# Patient Record
Sex: Male | Born: 1965 | Race: Black or African American | Hispanic: No | Marital: Single
Health system: Midwestern US, Community
[De-identification: ages and names within clinical notes are randomized; demographics above are authoritative.]

## PROBLEM LIST (undated history)

## (undated) DIAGNOSIS — H409 Unspecified glaucoma: Secondary | ICD-10-CM

## (undated) DIAGNOSIS — I10 Essential (primary) hypertension: Secondary | ICD-10-CM

## (undated) HISTORY — PX: OTHER SURGICAL HISTORY: SHX169

---

## 2007-09-30 ENCOUNTER — Emergency Department (HOSPITAL_COMMUNITY): Admission: EM | Admit: 2007-09-30 | Discharge: 2007-09-30 | Payer: Self-pay | Admitting: Emergency Medicine

## 2007-12-30 ENCOUNTER — Emergency Department (HOSPITAL_COMMUNITY): Admission: EM | Admit: 2007-12-30 | Discharge: 2007-12-30 | Payer: Self-pay | Admitting: Emergency Medicine

## 2012-03-30 ENCOUNTER — Emergency Department (HOSPITAL_COMMUNITY): Payer: Self-pay

## 2012-03-30 ENCOUNTER — Encounter (HOSPITAL_COMMUNITY): Payer: Self-pay | Admitting: Emergency Medicine

## 2012-03-30 ENCOUNTER — Emergency Department (HOSPITAL_COMMUNITY)
Admission: EM | Admit: 2012-03-30 | Discharge: 2012-03-30 | Disposition: A | Discharge status: Home / Self Care | Payer: Self-pay | Attending: Emergency Medicine | Admitting: Emergency Medicine

## 2012-03-30 DIAGNOSIS — R5383 Other fatigue: Secondary | ICD-10-CM | POA: Insufficient documentation

## 2012-03-30 DIAGNOSIS — R5381 Other malaise: Secondary | ICD-10-CM | POA: Insufficient documentation

## 2012-03-30 DIAGNOSIS — R112 Nausea with vomiting, unspecified: Secondary | ICD-10-CM | POA: Insufficient documentation

## 2012-03-30 DIAGNOSIS — I10 Essential (primary) hypertension: Secondary | ICD-10-CM | POA: Insufficient documentation

## 2012-03-30 DIAGNOSIS — Z7982 Long term (current) use of aspirin: Secondary | ICD-10-CM | POA: Insufficient documentation

## 2012-03-30 DIAGNOSIS — Z79899 Other long term (current) drug therapy: Secondary | ICD-10-CM | POA: Insufficient documentation

## 2012-03-30 DIAGNOSIS — R42 Dizziness and giddiness: Secondary | ICD-10-CM | POA: Insufficient documentation

## 2012-03-30 HISTORY — DX: Essential (primary) hypertension: I10

## 2012-03-30 LAB — CBC WITH DIFFERENTIAL/PLATELET
Basophils Absolute: 0 K/uL (ref 0.0–0.1)
Basophils Relative: 0 % (ref 0–1)
Eosinophils Absolute: 0.1 K/uL (ref 0.0–0.7)
Eosinophils Relative: 1 % (ref 0–5)
HCT: 44.5 % (ref 39.0–52.0)
Hemoglobin: 15.4 g/dL (ref 13.0–17.0)
Lymphocytes Relative: 20 % (ref 12–46)
Lymphs Abs: 1.1 K/uL (ref 0.7–4.0)
MCH: 28.2 pg (ref 26.0–34.0)
MCHC: 34.6 g/dL (ref 30.0–36.0)
MCV: 81.4 fL (ref 78.0–100.0)
Monocytes Absolute: 0.7 K/uL (ref 0.1–1.0)
Monocytes Relative: 11 % (ref 3–12)
Neutro Abs: 3.9 K/uL (ref 1.7–7.7)
Neutrophils Relative %: 68 % (ref 43–77)
Platelets: 157 K/uL (ref 150–400)
RBC: 5.47 MIL/uL (ref 4.22–5.81)
RDW: 13.8 % (ref 11.5–15.5)
WBC: 5.8 K/uL (ref 4.0–10.5)

## 2012-03-30 LAB — COMPREHENSIVE METABOLIC PANEL
ALT: 37 U/L (ref 0–53)
AST: 31 U/L (ref 0–37)
Albumin: 4.3 g/dL (ref 3.5–5.2)
Alkaline Phosphatase: 47 U/L (ref 39–117)
Chloride: 104 mEq/L (ref 96–112)
GFR calc Af Amer: >90 mL/min (ref >90)
Glucose, Bld: 95 mg/dL (ref 70–99)
Total Protein: 7.5 g/dL (ref 6.0–8.3)

## 2012-03-30 LAB — URINALYSIS, MICROSCOPIC ONLY
Bilirubin Urine: NEGATIVE
Glucose, UA: NEGATIVE mg/dL
Hgb urine dipstick: NEGATIVE
Ketones, ur: 40 mg/dL — AB
Leukocytes, UA: NEGATIVE
Nitrite: NEGATIVE
Protein, ur: NEGATIVE mg/dL
Specific Gravity, Urine: 1.031 — ABNORMAL HIGH (ref 1.005–1.030)
Urobilinogen, UA: 2 mg/dL — ABNORMAL HIGH (ref 0.0–1.0)
pH: 6 (ref 5.0–8.0)

## 2012-03-30 LAB — LACTIC ACID, PLASMA: Lactic Acid, Venous: 0.9 mmol/L (ref 0.5–2.2)

## 2012-03-30 MED ORDER — MORPHINE SULFATE 4 MG/ML IJ SOLN
4.0000 mg | Freq: Once | INTRAMUSCULAR | Status: AC
Start: 2012-03-30 — End: 2012-03-30
  Administered 2012-03-30: 4 mg via INTRAVENOUS
  Filled 2012-03-30: qty 1

## 2012-03-30 MED ORDER — IOHEXOL 300 MG/ML  SOLN
100.0000 mL | Freq: Once | INTRAMUSCULAR | Status: AC | PRN
  Administered 2012-03-30: 100 mL via INTRAVENOUS

## 2012-03-30 MED ORDER — ONDANSETRON HCL 4 MG/2ML IJ SOLN
4.0000 mg | Freq: Once | INTRAMUSCULAR | Status: AC
  Administered 2012-03-30: 4 mg via INTRAVENOUS
  Filled 2012-03-30: qty 2

## 2012-03-30 MED ORDER — SODIUM CHLORIDE 0.9 % IV SOLN
1000.0000 mL | Freq: Once | INTRAVENOUS | Status: AC
  Administered 2012-03-30: 1000 mL via INTRAVENOUS

## 2012-03-30 MED ORDER — IOHEXOL 300 MG/ML  SOLN
50.0000 mL | Freq: Once | INTRAMUSCULAR | Status: AC | PRN
  Administered 2012-03-30: 50 mL via ORAL

## 2012-03-30 MED ORDER — ONDANSETRON HCL 4 MG PO TABS: 4.0000 mg | ORAL_TABLET | Freq: Four times a day (QID) | ORAL | Status: DC

## 2012-03-30 NOTE — ED Provider Notes (Signed)
History     CSN: [id number]  Arrival date & time 03/30/12  1002   First MD Initiated Contact with Patient 03/30/12 1013      Chief Complaint  Patient presents with  . Abdominal Pain    (Consider location/radiation/quality/duration/timing/severity/associated sxs/prior treatment) HPI Comments: Patient claims of abdominal pain with diarrhea and nausea and vomiting over the past 5 days. He states it started 5 days ago after he ate a chicken salad. The next day he had diarrhea with lower abdominal pain. This improved over the next day but then he develops nausea and vomiting with continued lower abdominal pain. He's had no diarrhea for the past 3 days. This morning he began throwing up again and unable to keep anything down. Denies sick contacts at home. Denies any antibiotics recently. Denies any testicular pain, dysuria or hematuria. Has not checked his temperature.  The history is provided by the patient and the spouse.    Past Medical History  Diagnosis Date  . Hypertension     History reviewed. No pertinent past surgical history.  No family history on file.  History  Substance Use Topics  . Smoking status: Never Smoker   . Smokeless tobacco: Not on file  . Alcohol Use: No      Review of Systems  Constitutional: Positive for activity change, appetite change and fatigue. Negative for fever.  HENT: Negative for congestion and rhinorrhea.   Eyes: Negative for visual disturbance.  Respiratory: Negative for cough, chest tightness and shortness of breath.   Cardiovascular: Negative for chest pain.  Gastrointestinal: Positive for nausea, vomiting, abdominal pain and diarrhea.  Genitourinary: Negative for dysuria and hematuria.  Musculoskeletal: Negative for back pain.  Skin: Negative for rash.  Neurological: Positive for dizziness and weakness. Negative for headaches.  A complete 10 system review of systems was obtained and all systems are negative except as noted in the HPI  and PMH.    Allergies  Review of patient's allergies indicates no known allergies.  Home Medications   Current Outpatient Rx  Name  Route  Sig  Dispense  Refill  . aspirin 81 MG tablet   Oral   Take 81 mg by mouth daily.         Marland Kitchen lisinopril (PRINIVIL,ZESTRIL) 20 MG tablet   Oral   Take 20 mg by mouth daily.         . ondansetron (ZOFRAN) 4 MG tablet   Oral   Take 1 tablet (4 mg total) by mouth every 6 (six) hours.   12 tablet   0     BP 136/91  Pulse 58  Temp(Src) 97.5 F (36.4 C) (Oral)  Resp 16  SpO2 97%  Physical Exam  Constitutional: He is oriented to person, place, and time. He appears well-developed and well-nourished. No distress.  HENT:  Head: Normocephalic and atraumatic.  Mouth/Throat: No oropharyngeal exudate.  Mildly dry mucous membranes  Eyes: Conjunctivae and EOM are normal. Pupils are equal, round, and reactive to light.  Cardiovascular: Normal rate, regular rhythm and normal heart sounds.   No murmur heard. Pulmonary/Chest: Effort normal and breath sounds normal. No respiratory distress.  Abdominal: Soft. There is tenderness. There is no rebound and no guarding.  Generalized diffuse abdominal tenderness  Musculoskeletal: Normal range of motion. He exhibits no edema and no tenderness.  Neurological: He is alert and oriented to person, place, and time. No cranial nerve deficit. He exhibits normal muscle tone. Coordination normal.  Skin: Skin is warm.  ED Course  Procedures (including critical care time)  Labs Reviewed  COMPREHENSIVE METABOLIC PANEL - Abnormal; Notable for the following:    Potassium 3.3 (*)    Total Bilirubin 1.4 (*)    GFR calc non Af Amer 85 (*)    All other components within normal limits  URINALYSIS, MICROSCOPIC ONLY - Abnormal; Notable for the following:    APPearance CLOUDY (*)    Specific Gravity, Urine 1.031 (*)    Ketones, ur 40 (*)    Urobilinogen, UA 2.0 (*)    All other components within normal limits   CBC WITH DIFFERENTIAL  LIPASE, BLOOD  LACTIC ACID, PLASMA   Ct Abdomen Pelvis W Contrast  03/30/2012  *RADIOLOGY REPORT*  Clinical Data: Abdominal pain with nausea and vomiting for 4 days.  CT ABDOMEN AND PELVIS WITH CONTRAST  Technique:  Multidetector CT imaging of the abdomen and pelvis was performed following the standard protocol during bolus administration of intravenous contrast.  Contrast: OMNIPAQUE IOHEXOL 300 MG/ML  SOLN  Comparison: None.  Findings: Lung bases are clear.  No pleural or pericardial fluid.  The liver has a normal appearance except for a 3 mm cyst at the dome.  No calcified gallstones.  The spleen is normal.  The pancreas is normal.  The adrenal glands are normal.  The kidneys are normal except for a few punctate calculi.  No cyst, mass, or hydronephrosis.  The aorta shows minimal atherosclerosis.  The IVC is normal.  No retroperitoneal mass or adenopathy.  There is been previous appendectomy.  There is mild diverticulosis of the colon but no sign of diverticulitis.  No small bowel abnormality is seen. Bladder, prostate gland and seminal vesicles are unremarkable.  No significant bony finding.  IMPRESSION: No acute or significant finding.  No cause of the patient's described symptoms is identified.   Original Report Authenticated By: Paulina Fusi, M.D.      1. Nausea and vomiting       MDM  Four day history of abdominal pain, intermittent diarrhea which is now improved associated with nausea and vomiting it recurred again this morning. Vital stable, no distress, diffuse abdominal tenderness without peritoneal signs. Patient was worried that started after eating a salad but that was 5 days ago.  Ketones in urine.  Electrolytes unremarkable.    Given patient's duration of symptoms and his abdominal pain, we'll obtain CT imaging to rule out appendicitis.  CT negative. Patient has tolerated by mouth in ED and had no more vomiting or diarrhea. Will treat supportively  with antiemetics. Return precautions discussed.      Glynn Octave, MD 03/30/12 1525

## 2012-03-30 NOTE — ED Notes (Signed)
Pt presenting to ed with c/o abdominal pain with positive nausea, vomiting and diarrhea. Pt states he ate a salad on Wednesday and has been sick since then.

## 2012-05-03 ENCOUNTER — Encounter (HOSPITAL_COMMUNITY): Payer: Self-pay | Admitting: *Deleted

## 2012-05-03 ENCOUNTER — Emergency Department (HOSPITAL_COMMUNITY)
Admission: EM | Admit: 2012-05-03 | Discharge: 2012-05-03 | Disposition: A | Discharge status: Home / Self Care | Payer: Self-pay | Attending: Emergency Medicine | Admitting: Emergency Medicine

## 2012-05-03 DIAGNOSIS — Z7982 Long term (current) use of aspirin: Secondary | ICD-10-CM | POA: Insufficient documentation

## 2012-05-03 DIAGNOSIS — H579 Unspecified disorder of eye and adnexa: Secondary | ICD-10-CM | POA: Insufficient documentation

## 2012-05-03 DIAGNOSIS — H109 Unspecified conjunctivitis: Secondary | ICD-10-CM | POA: Insufficient documentation

## 2012-05-03 DIAGNOSIS — Z79899 Other long term (current) drug therapy: Secondary | ICD-10-CM | POA: Insufficient documentation

## 2012-05-03 DIAGNOSIS — H5789 Other specified disorders of eye and adnexa: Secondary | ICD-10-CM | POA: Insufficient documentation

## 2012-05-03 DIAGNOSIS — J3489 Other specified disorders of nose and nasal sinuses: Secondary | ICD-10-CM | POA: Insufficient documentation

## 2012-05-03 DIAGNOSIS — I10 Essential (primary) hypertension: Secondary | ICD-10-CM | POA: Insufficient documentation

## 2012-05-03 MED ORDER — TOBRAMYCIN 0.3 % OP SOLN
2.0000 [drp] | OPHTHALMIC | Status: DC
  Administered 2012-05-03: 2 [drp] via OPHTHALMIC
  Filled 2012-05-03: qty 5

## 2012-05-03 NOTE — ED Provider Notes (Signed)
History     CSN: [id number]  Arrival date & time 05/03/12  0133   First MD Initiated Contact with Patient 05/03/12 0254      Chief Complaint  Patient presents with  . Eye Problem   HPI  History provided by the patient. Patient is a 47 year old male with history of hypertension who presents with complaints of left eye redness, swelling drainage. Symptoms first began Thursday with some bilateral eye itching and redness. Patient also had some rhinorrhea and congestion symptoms. After awaking Friday morning he had significant drainage and crusting from the left eye. He denies having any significant pain to the eye. Symptoms have been persistent through the day with continued watering and drainage from the eye. Patient is not use any treatment symptoms. He has no significant history of eye problems in the past. Does not use corrective lenses or contacts. Denies any known sick contacts. No recent travel. No other aggravating or alleviating factors. No other associated symptoms. No fever, chills or sweats.     Past Medical History  Diagnosis Date  . Hypertension     History reviewed. No pertinent past surgical history.  No family history on file.  History  Substance Use Topics  . Smoking status: Never Smoker   . Smokeless tobacco: Not on file  . Alcohol Use: Yes      Review of Systems  Constitutional: Negative for fever and chills.  HENT: Positive for congestion and rhinorrhea.   Eyes: Positive for discharge, redness and itching. Negative for photophobia and pain.  Respiratory: Negative for cough.   All other systems reviewed and are negative.    Allergies  Review of patient's allergies indicates no known allergies.  Home Medications   Current Outpatient Rx  Name  Route  Sig  Dispense  Refill  . aspirin 81 MG tablet   Oral   Take 81 mg by mouth daily.         . hydrochlorothiazide (HYDRODIURIL) 25 MG tablet   Oral   Take 25 mg by mouth daily.         Marland Kitchen  losartan (COZAAR) 50 MG tablet   Oral   Take 50 mg by mouth daily.           BP 107/78  Pulse 83  Temp(Src) 97.9 F (36.6 C) (Oral)  Resp 16  SpO2 95%  Physical Exam  Nursing note and vitals reviewed. Constitutional: He is oriented to person, place, and time. He appears well-developed and well-nourished. No distress.  HENT:  Head: Normocephalic.  Eyes: EOM are normal. Pupils are equal, round, and reactive to light. Left eye exhibits discharge. Left eye exhibits no chemosis. Right conjunctiva is not injected. Left conjunctiva is injected.  Cardiovascular: Normal rate and regular rhythm.   Pulmonary/Chest: Effort normal and breath sounds normal.  Abdominal: Soft.  Musculoskeletal: Normal range of motion.  Neurological: He is alert and oriented to person, place, and time.  Skin: Skin is warm and dry.  Psychiatric: He has a normal mood and affect. His behavior is normal.    ED Course  Procedures     1. Conjunctivitis       MDM  3:10AM patient seen and evaluated. Patient appears well in no acute distress.  Patient with erythema and discharge of the left eye conjunctiva. He denies any significant pains. Symptoms consistent with conjunctivitis. Tobramycin drops provided with ophthalmology referral.        Angus Seller, PA-C 05/03/12 0554    Medical screening  examination/treatment/procedure(s) were performed by non-physician practitioner and as supervising physician I was immediately available for consultation/collaboration.   Derwood Kaplan, MD 05/04/12 [phone]

## 2012-05-03 NOTE — ED Notes (Addendum)
C/o bilateral eye redness, onset Thursday, drainage onset Friday, describes crusty exudate, L>R, "thinks he rubbed them while he was sleeping". Denies fever. Eyes red and irritated. Denies pain, "just irritation".

## 2013-10-09 ENCOUNTER — Emergency Department (HOSPITAL_COMMUNITY)
Admission: EM | Admit: 2013-10-09 | Discharge: 2013-10-09 | Disposition: A | Discharge status: Home / Self Care | Payer: Self-pay | Attending: Emergency Medicine | Admitting: Emergency Medicine

## 2013-10-09 ENCOUNTER — Encounter (HOSPITAL_COMMUNITY): Payer: Self-pay | Admitting: Emergency Medicine

## 2013-10-09 DIAGNOSIS — Z8669 Personal history of other diseases of the nervous system and sense organs: Secondary | ICD-10-CM | POA: Insufficient documentation

## 2013-10-09 DIAGNOSIS — S0990XA Unspecified injury of head, initial encounter: Secondary | ICD-10-CM | POA: Insufficient documentation

## 2013-10-09 DIAGNOSIS — IMO0002 Reserved for concepts with insufficient information to code with codable children: Secondary | ICD-10-CM | POA: Insufficient documentation

## 2013-10-09 DIAGNOSIS — Z79899 Other long term (current) drug therapy: Secondary | ICD-10-CM | POA: Insufficient documentation

## 2013-10-09 DIAGNOSIS — Y9289 Other specified places as the place of occurrence of the external cause: Secondary | ICD-10-CM | POA: Insufficient documentation

## 2013-10-09 DIAGNOSIS — Y9389 Activity, other specified: Secondary | ICD-10-CM | POA: Insufficient documentation

## 2013-10-09 DIAGNOSIS — W208XXA Other cause of strike by thrown, projected or falling object, initial encounter: Secondary | ICD-10-CM | POA: Insufficient documentation

## 2013-10-09 DIAGNOSIS — S0001XA Abrasion of scalp, initial encounter: Secondary | ICD-10-CM

## 2013-10-09 DIAGNOSIS — Z7982 Long term (current) use of aspirin: Secondary | ICD-10-CM | POA: Insufficient documentation

## 2013-10-09 DIAGNOSIS — I10 Essential (primary) hypertension: Secondary | ICD-10-CM | POA: Insufficient documentation

## 2013-10-09 HISTORY — DX: Unspecified glaucoma: H40.9

## 2013-10-09 MED ORDER — ACETAMINOPHEN ER 650 MG PO TBCR: 650.0000 mg | EXTENDED_RELEASE_TABLET | Freq: Three times a day (TID) | ORAL | Status: DC | PRN

## 2013-10-09 MED ORDER — ONDANSETRON 4 MG PO TBDP
4.0000 mg | ORAL_TABLET | Freq: Once | ORAL | Status: AC
  Administered 2013-10-09: 4 mg via ORAL
  Filled 2013-10-09: qty 1

## 2013-10-09 MED ORDER — PROMETHAZINE HCL 25 MG PO TABS: 25.0000 mg | ORAL_TABLET | Freq: Four times a day (QID) | ORAL | Status: DC | PRN

## 2013-10-09 MED ORDER — ACETAMINOPHEN 500 MG PO TABS
1000.0000 mg | ORAL_TABLET | Freq: Once | ORAL | Status: AC
  Administered 2013-10-09: 1000 mg via ORAL
  Filled 2013-10-09: qty 2

## 2013-10-09 NOTE — ED Provider Notes (Signed)
CSN: [id number]     Arrival date & time 10/09/13  4098 History  This chart was scribed for non-physician practitioner, Emilia Beck, PA-C, working with Doug Sou, MD, by Modena Jansky, ED Scribe. This patient was seen in room WTR6/WTR6 and the patient's care was started at 6:54 PM.  Chief Complaint  Patient presents with  . Head Injury  . Headache   The history is provided by the patient. No language interpreter was used.   HPI Comments: Jon Green is a 48 y.o. male who presents to the Emergency Department complaining of a headache that started about 2 hours ago. He states that a ceiling tile fell on his head while [place] [employer]. He denies any LOC. He currently rates the severity of the pain as a 8/10. He describes the pain as a throbbing sensation. He reports associated dizziness onset later on. He denies any blurry vision or trouble ambulating.   Past Medical History  Diagnosis Date  . Hypertension   . Glaucoma    History reviewed. No pertinent past surgical history. History reviewed. No pertinent family history. History  Substance Use Topics  . Smoking status: Never Smoker   . Smokeless tobacco: Not on file  . Alcohol Use: Yes    Review of Systems  Neurological: Positive for dizziness and headaches.  All other systems reviewed and are negative.   Allergies  Review of patient's allergies indicates no known allergies.  Home Medications   Prior to Admission medications   Medication Sig Start Date End Date Taking? Authorizing Provider  aspirin 81 MG tablet Take 81 mg by mouth daily.    Historical Provider, MD  hydrochlorothiazide (HYDRODIURIL) 25 MG tablet Take 25 mg by mouth daily.    Historical Provider, MD  losartan (COZAAR) 50 MG tablet Take 50 mg by mouth daily.    Historical Provider, MD   BP 122/63  Pulse 63  Temp(Src) 98.5 F (36.9 C) (Oral)  Resp 16  SpO2 99% Physical Exam  Nursing note and vitals reviewed. Constitutional:  He is oriented to person, place, and time. He appears well-developed and well-nourished.  HENT:  Mild abrasion to left temporal parietal scalp. Mild TTP. No open wounds or hematoma noted.   Eyes: Pupils are equal, round, and reactive to light.  Neck: Neck supple. No tracheal deviation present.  Cardiovascular: Normal rate.   Pulmonary/Chest: Effort normal. No respiratory distress.  Musculoskeletal: Normal range of motion.  Neurological: He is alert and oriented to person, place, and time. Coordination normal.  Extremity strength and sensation intact bilaterally.   Skin: Skin is warm and dry.  Psychiatric: He has a normal mood and affect. His behavior is normal.    ED Course  Procedures (including critical care time) DIAGNOSTIC STUDIES: Oxygen Saturation is 99% on RA, normal by my interpretation.    COORDINATION OF CARE: 6:59 PM- Pt advised of plan for treatment which includes medication and pt agrees.  Labs Review Labs Reviewed - No data to display  Imaging Review No results found.   EKG Interpretation None      MDM   Final diagnoses:  Head injury, initial encounter  Scalp abrasion, initial encounter    7:56 PM Patient given tylenol and zofran for symptoms. Patient will not have a head CT based on [place] CT criteria and absence of symptoms. Patient denies LOC. Vitals stable and patient afebrile. No hematoma or depressed skull fracture noted on clinical exam. Patient will be discharged with tylenol  and phenergan for symptoms. No neuro deficits.   I personally performed the services described in this documentation, which was scribed in my presence. The recorded information has been reviewed and is accurate.     Emilia Beck, [place] 10/09/13 [phone]

## 2013-10-09 NOTE — ED Notes (Signed)
Patient with no complaints at this time. Respirations even and unlabored. Skin warm/dry. Discharge instructions reviewed with patient at this time. Patient given opportunity to voice concerns/ask questions. Patient discharged at this time and left Emergency Department with steady gait.   

## 2013-10-09 NOTE — Discharge Instructions (Signed)
Take tylenol as needed for pain. Take phenergan as needed for nausea. Refer to attached documents for more information.

## 2013-10-09 NOTE — ED Notes (Addendum)
Pt c/o headache and dizziness after a drop ceiling tile fell on his head, at [employer].  Pain score 8/10.  Denies blurred vision.  No hematoma noted.

## 2013-10-10 NOTE — ED Provider Notes (Signed)
Medical screening examination/treatment/procedure(s) were performed by non-physician practitioner and as supervising physician I was immediately available for consultation/collaboration.   EKG Interpretation None       Doug Sou, MD 10/10/13 2130

## 2014-02-18 ENCOUNTER — Encounter (HOSPITAL_COMMUNITY): Payer: Self-pay | Admitting: Emergency Medicine

## 2014-02-18 ENCOUNTER — Emergency Department (HOSPITAL_COMMUNITY)
Admission: EM | Admit: 2014-02-18 | Discharge: 2014-02-18 | Disposition: A | Discharge status: Home / Self Care | Payer: Self-pay | Attending: Emergency Medicine | Admitting: Emergency Medicine

## 2014-02-18 DIAGNOSIS — Z711 Person with feared health complaint in whom no diagnosis is made: Secondary | ICD-10-CM

## 2014-02-18 DIAGNOSIS — H409 Unspecified glaucoma: Secondary | ICD-10-CM | POA: Insufficient documentation

## 2014-02-18 DIAGNOSIS — R300 Dysuria: Secondary | ICD-10-CM | POA: Insufficient documentation

## 2014-02-18 DIAGNOSIS — I10 Essential (primary) hypertension: Secondary | ICD-10-CM | POA: Insufficient documentation

## 2014-02-18 DIAGNOSIS — Z202 Contact with and (suspected) exposure to infections with a predominantly sexual mode of transmission: Secondary | ICD-10-CM | POA: Insufficient documentation

## 2014-02-18 DIAGNOSIS — R369 Urethral discharge, unspecified: Secondary | ICD-10-CM | POA: Insufficient documentation

## 2014-02-18 LAB — URINALYSIS, ROUTINE W REFLEX MICROSCOPIC
GLUCOSE, UA: NEGATIVE mg/dL
KETONES UR: NEGATIVE mg/dL
NITRITE: NEGATIVE
Protein, ur: 30 mg/dL — AB
Specific Gravity, Urine: 1.036 — ABNORMAL HIGH (ref 1.005–1.030)
UROBILINOGEN UA: 1 mg/dL (ref 0.0–1.0)
pH: 5 (ref 5.0–8.0)

## 2014-02-18 LAB — GC/CHLAMYDIA PROBE AMP (~~LOC~~) NOT AT ARMC
CHLAMYDIA, DNA PROBE: NEGATIVE
NEISSERIA GONORRHEA: POSITIVE — AB

## 2014-02-18 LAB — URINE MICROSCOPIC-ADD ON

## 2014-02-18 MED ORDER — AZITHROMYCIN 250 MG PO TABS
1000.0000 mg | ORAL_TABLET | Freq: Once | ORAL | Status: AC
  Administered 2014-02-18: 1000 mg via ORAL
  Filled 2014-02-18: qty 4

## 2014-02-18 MED ORDER — STERILE WATER FOR INJECTION IJ SOLN
INTRAMUSCULAR | Status: AC
  Administered 2014-02-18: 2 mL
  Filled 2014-02-18: qty 10

## 2014-02-18 MED ORDER — CEFTRIAXONE SODIUM 250 MG IJ SOLR
250.0000 mg | Freq: Once | INTRAMUSCULAR | Status: AC
  Administered 2014-02-18: 250 mg via INTRAMUSCULAR
  Filled 2014-02-18: qty 250

## 2014-02-18 NOTE — ED Provider Notes (Signed)
CSN: [id number]     Arrival date & time 02/18/14  0023 History   First MD Initiated Contact with Patient 02/18/14 0037     Chief Complaint  Patient presents with  . Exposure to STD    (Consider location/radiation/quality/duration/timing/severity/associated sxs/prior Treatment) HPI Comments: 49 year old male sent to the emergency department for further evaluation of penile discharge and dysuria 2 days. Symptoms are gradually worsening. He states that he was told by his girlfriend that she was sexually active with another partner while he was out of town over the past 5 months. Patient is concerned that his symptoms are secondary to an STD. He states that his penile discharge has been whitish yellow in color. He denies any associated fever, abdominal pain, vomiting, hematuria, genital lesions or sores, testicular tenderness, or scrotal swelling.  Patient is a 49 y.o. male presenting with STD exposure. The history is provided by the patient. No language interpreter was used.  Exposure to STD This is a new problem. Episode onset: penile discharge began yesterday. The problem occurs constantly. The problem has been gradually worsening. Pertinent negatives include no abdominal pain, change in bowel habit, diaphoresis, fever, rash or vomiting. Nothing aggravates the symptoms. He has tried nothing for the symptoms.    Past Medical History  Diagnosis Date  . Hypertension   . Glaucoma    History reviewed. No pertinent past surgical history. History reviewed. No pertinent family history. History  Substance Use Topics  . Smoking status: Never Smoker   . Smokeless tobacco: Not on file  . Alcohol Use: No    Review of Systems  Constitutional: Negative for fever and diaphoresis.  Gastrointestinal: Negative for vomiting, abdominal pain and change in bowel habit.  Genitourinary: Positive for dysuria and discharge. Negative for penile swelling, scrotal swelling, penile pain and testicular pain.  Skin:  Negative for rash.  All other systems reviewed and are negative.   Allergies  Review of patient's allergies indicates no known allergies.  Home Medications   Prior to Admission medications   Medication Sig Start Date End Date Taking? Authorizing Provider  acetaminophen (TYLENOL 8 HOUR) 650 MG CR tablet Take 1 tablet (650 mg total) by mouth every 8 (eight) hours as needed for pain. 10/09/13   Kaitlyn Szekalski, PA-C  aspirin 81 MG tablet Take 81 mg by mouth daily.    Historical Provider, MD  hydrochlorothiazide (HYDRODIURIL) 25 MG tablet Take 25 mg by mouth daily.    Historical Provider, MD  losartan (COZAAR) 50 MG tablet Take 50 mg by mouth daily.    Historical Provider, MD  promethazine (PHENERGAN) 25 MG tablet Take 1 tablet (25 mg total) by mouth every 6 (six) hours as needed for nausea or vomiting. 10/09/13   Kaitlyn Szekalski, PA-C   BP 149/101 mmHg  Pulse 72  Temp(Src) 97.7 F (36.5 C) (Oral)  Resp 18  SpO2 97%   Physical Exam  Constitutional: He is oriented to person, place, and time. He appears well-developed and well-nourished. No distress.  Nontoxic/nonseptic appearing  HENT:  Head: Normocephalic and atraumatic.  Eyes: Conjunctivae and EOM are normal. No scleral icterus.  Neck: Normal range of motion.  Pulmonary/Chest: Effort normal. No respiratory distress.  Respirations even and unlabored  Abdominal: Soft. He exhibits no distension. There is no tenderness. There is no rebound. Hernia confirmed negative in the right inguinal area and confirmed negative in the left inguinal area.  Abdomen soft without masses or tenderness  Genitourinary: Right testis shows no mass, no swelling and no  tenderness. Right testis is descended. Left testis shows no mass, no swelling and no tenderness. Left testis is descended. Circumcised. No penile tenderness. Discharge found.  Chaperoned GU exam notable for penile discharge which is pale yellow in color  Musculoskeletal: Normal range of  motion.  Neurological: He is alert and oriented to person, place, and time.  Skin: Skin is warm and dry. No rash noted. He is not diaphoretic. No erythema. No pallor.  Psychiatric: He has a normal mood and affect. His behavior is normal.  Nursing note and vitals reviewed.   ED Course  Procedures (including critical care time) Labs Review Labs Reviewed  URINALYSIS, ROUTINE W REFLEX MICROSCOPIC - Abnormal; Notable for the following:    Color, Urine AMBER (*)    APPearance CLOUDY (*)    Specific Gravity, Urine 1.036 (*)    Hgb urine dipstick TRACE (*)    Bilirubin Urine SMALL (*)    Protein, ur 30 (*)    Leukocytes, UA MODERATE (*)    All other components within normal limits  URINE MICROSCOPIC-ADD ON - Abnormal; Notable for the following:    Bacteria, UA FEW (*)    All other components within normal limits  GC/CHLAMYDIA PROBE AMP ([facility])    Imaging Review No results found.   EKG Interpretation None      MDM   Final diagnoses:  Penile discharge  Concern about STD in male without diagnosis    Patient to be discharged with instructions to follow up with the [facility]. Discussed importance of using protection when sexually active. Pt understands that they have GC/Chlamydia cultures pending and that they will need to inform all sexual partners if results return positive. Pt has been treated prophylacticly with azithromycin and rocephin due to pts history, physical exam, and increased WBCs on UA. Return precautions discussed and provided. Patient agreeable to plan with no unaddressed concerns.   Filed Vitals:   02/18/14 0031  BP: 149/101  Pulse: 72  Temp: 97.7 F (36.5 C)  TempSrc: Oral  Resp: 18  SpO2: 97%     Antony Madura, PA-C 02/18/14 0149  Ward Givens, MD 02/18/14 8413

## 2014-02-18 NOTE — ED Notes (Signed)
Pt ambulating independently w/ steady gait on d/c in no acute distress, A&Ox4.  

## 2014-02-18 NOTE — ED Notes (Signed)
Pt states he has burning with urination and discharge coming from penis. Pt significant other cheated and he would like to be tested for STD.

## 2014-02-18 NOTE — Discharge Instructions (Signed)
You have been treated in the ED for gonorrhea and chlamydia with Rocephin and azithromycin. Follow-up with the health department in 2 days regarding the results of your tests. Do not engage in sexual intercourse for one week. If you are going to engage in sexual intercourse with a partner who may have given you an STD, this partner must be tested and treated as well as 7 days out from treatment until you reengage in sexual intercourse. Recommend the use of condoms. Return to the emergency department as needed if symptoms worsen.  Sexually Transmitted Disease A sexually transmitted disease (STD) is a disease or infection that may be passed (transmitted) from person to person, usually during sexual activity. This may happen by way of saliva, semen, blood, vaginal mucus, or urine. Common STDs include:   Gonorrhea.   Chlamydia.   Syphilis.   HIV and AIDS.   Genital herpes.   Hepatitis B and C.   Trichomonas.   Human papillomavirus (HPV).   Pubic lice.   Scabies.  Mites.  Bacterial vaginosis. WHAT ARE CAUSES OF STDs? An STD may be caused by bacteria, a virus, or parasites. STDs are often transmitted during sexual activity if one person is infected. However, they may also be transmitted through nonsexual means. STDs may be transmitted after:   Sexual intercourse with an infected person.   Sharing sex toys with an infected person.   Sharing needles with an infected person or using unclean piercing or tattoo needles.  Having intimate contact with the genitals, mouth, or rectal areas of an infected person.   Exposure to infected fluids during birth. WHAT ARE THE SIGNS AND SYMPTOMS OF STDs? Different STDs have different symptoms. Some people may not have any symptoms. If symptoms are present, they may include:   Painful or bloody urination.   Pain in the pelvis, abdomen, vagina, anus, throat, or eyes.   A skin rash, itching, or irritation.  Growths, ulcerations,  blisters, or sores in the genital and anal areas.  Abnormal vaginal discharge with or without bad odor.   Penile discharge in men.   Fever.   Pain or bleeding during sexual intercourse.   Swollen glands in the groin area.   Yellow skin and eyes (jaundice). This is seen with hepatitis.   Swollen testicles.  Infertility.  Sores and blisters in the mouth. HOW ARE STDs DIAGNOSED? To make a diagnosis, your health care provider may:   Take a medical history.   Perform a physical exam.   Take a sample of any discharge to examine.  Swab the throat, cervix, opening to the penis, rectum, or vagina for testing.  Test a sample of your first morning urine.   Perform blood tests.   Perform a Pap test, if this applies.   Perform a colposcopy.   Perform a laparoscopy.  HOW ARE STDs TREATED? Treatment depends on the STD. Some STDs may be treated but not cured.   Chlamydia, gonorrhea, trichomonas, and syphilis can be cured with antibiotic medicine.   Genital herpes, hepatitis, and HIV can be treated, but not cured, with prescribed medicines. The medicines lessen symptoms.   Genital warts from HPV can be treated with medicine or by freezing, burning (electrocautery), or surgery. Warts may come back.   HPV cannot be cured with medicine or surgery. However, abnormal areas may be removed from the cervix, vagina, or vulva.   If your diagnosis is confirmed, your recent sexual partners need treatment. This is true even if they are symptom-free  or have a negative culture or evaluation. They should not have sex until their health care providers say it is okay. HOW CAN I REDUCE MY RISK OF GETTING AN STD? Take these steps to reduce your risk of getting an STD:  Use latex condoms, dental dams, and water-soluble lubricants during sexual activity. Do not use petroleum jelly or oils.  Avoid having multiple sex partners.  Do not have sex with someone who has other sex  partners.  Do not have sex with anyone you do not know or who is at high risk for an STD.  Avoid risky sex practices that can break your skin.  Do not have sex if you have open sores on your mouth or skin.  Avoid drinking too much alcohol or taking illegal drugs. Alcohol and drugs can affect your judgment and put you in a vulnerable position.  Avoid engaging in oral and anal sex acts.  Get vaccinated for HPV and hepatitis. If you have not received these vaccines in the past, talk to your health care provider about whether one or both might be right for you.   If you are at risk of being infected with HIV, it is recommended that you take a prescription medicine daily to prevent HIV infection. This is called pre-exposure prophylaxis (PrEP). You are considered at risk if:  You are a man who has sex with other men (MSM).  You are a heterosexual man or woman and are sexually active with more than one partner.  You take drugs by injection.  You are sexually active with a partner who has HIV.  Talk with your health care provider about whether you are at high risk of being infected with HIV. If you choose to begin PrEP, you should first be tested for HIV. You should then be tested every 3 months for as long as you are taking PrEP.  WHAT SHOULD I DO IF I THINK I HAVE AN STD?  See your health care provider.   Tell your sexual partner(s). They should be tested and treated for any STDs.  Do not have sex until your health care provider says it is okay. WHEN SHOULD I GET IMMEDIATE MEDICAL CARE? Contact your health care provider right away if:   You have severe abdominal pain.  You are a man and notice swelling or pain in your testicles.  You are a woman and notice swelling or pain in your vagina. Document Released: 03/24/2002 Document Revised: 01/06/2013 Document Reviewed: 07/22/2012 [facility] Patient Information 2015 [place], [place]. This information is not intended to replace advice  given to you by your health care provider. Make sure you discuss any questions you have with your health care provider.

## 2014-02-19 ENCOUNTER — Telehealth (HOSPITAL_BASED_OUTPATIENT_CLINIC_OR_DEPARTMENT_OTHER): Payer: Self-pay | Admitting: *Deleted

## 2014-02-22 ENCOUNTER — Telehealth (HOSPITAL_COMMUNITY): Payer: Self-pay

## 2014-02-22 NOTE — ED Notes (Signed)
Positive for gonorrhea. Treated per protocol. Unable to reach by telephone. Letter sent to address on file.

## 2014-03-11 ENCOUNTER — Telehealth (HOSPITAL_COMMUNITY): Payer: Self-pay

## 2014-03-11 NOTE — ED Notes (Signed)
Unable to contact pt by mail or telephone. Unable to communicate lab results or treatment changes. 

## 2014-04-11 IMAGING — CT CT ABD-PELV W/ CM
1 of 3 series · 14 of 32 positions shown, 19 images · IV contrast (OMNIPAQUE 300)
Comparison: None.

CLINICAL DATA: Abdominal pain with nausea and vomiting for 4 days.

CT ABDOMEN AND PELVIS WITH CONTRAST
TECHNIQUE: Multidetector CT imaging of the abdomen and pelvis was
performed following the standard protocol during bolus
administration of intravenous contrast.
Contrast: 100mL OMNIPAQUE IOHEXOL 300 MG/ML  SOLN

[Series 2: abd/pel with · axial · 0.74mm/px · z∈[-380,+65]mm · 14 of 101 slices shown, 19 images]
[im 6/101  soft-tissue]
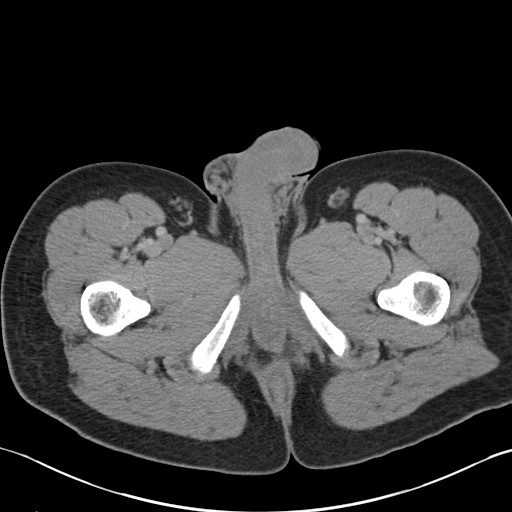
[im 6/101  bone]
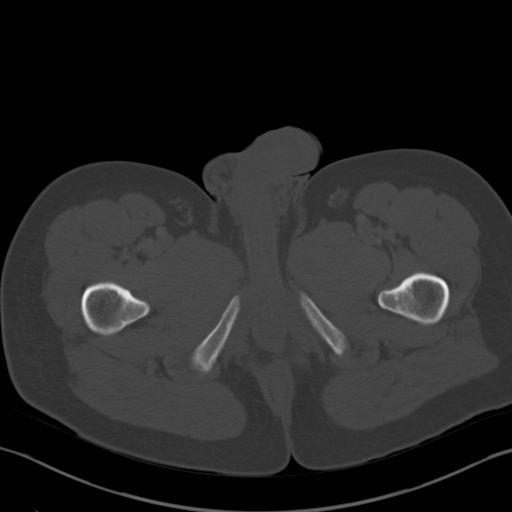
[im 12/101  soft-tissue]
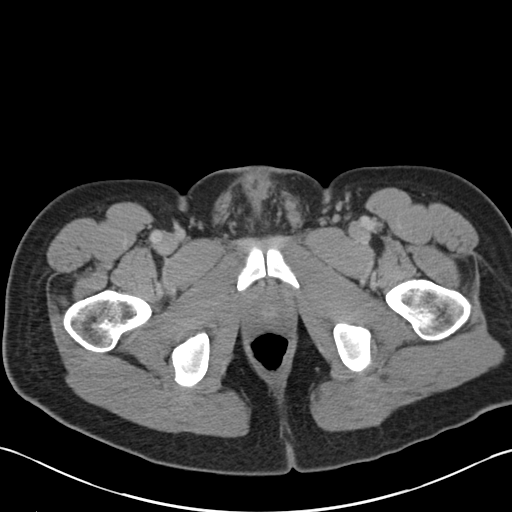
[im 23/101  soft-tissue]
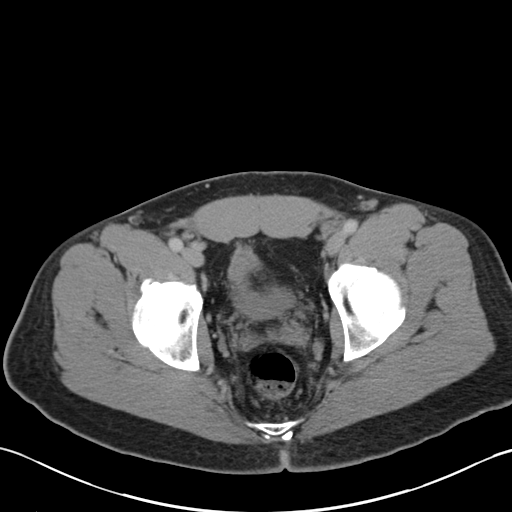
[im 28/101  soft-tissue]
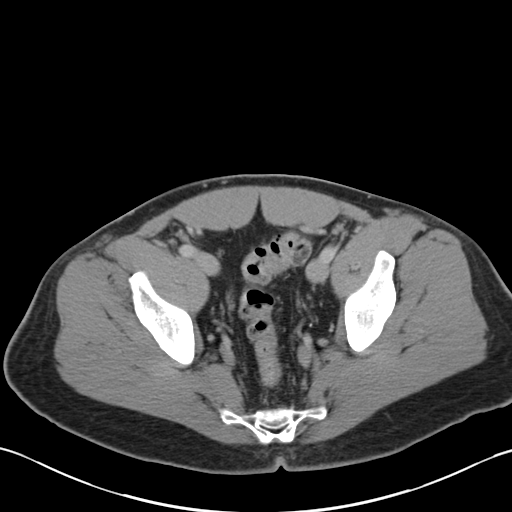
[im 34/101  soft-tissue]
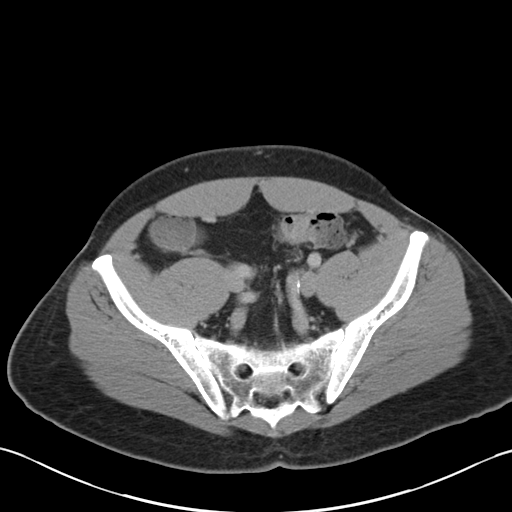
[im 45/101  soft-tissue]
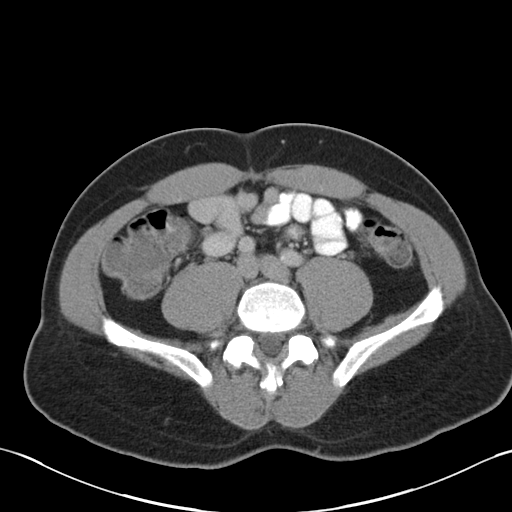
[im 51/101  soft-tissue]
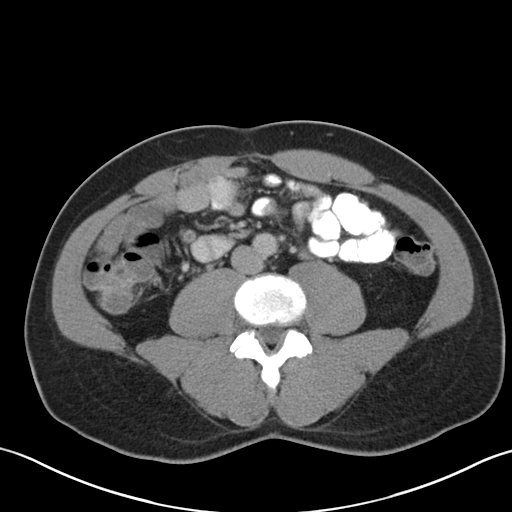
[im 56/101  soft-tissue]
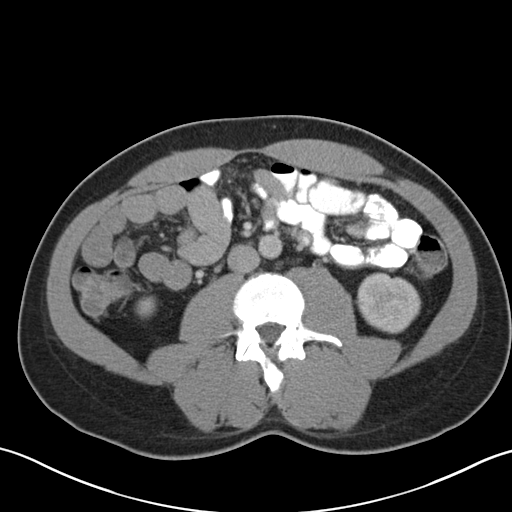
[im 67/101  soft-tissue]
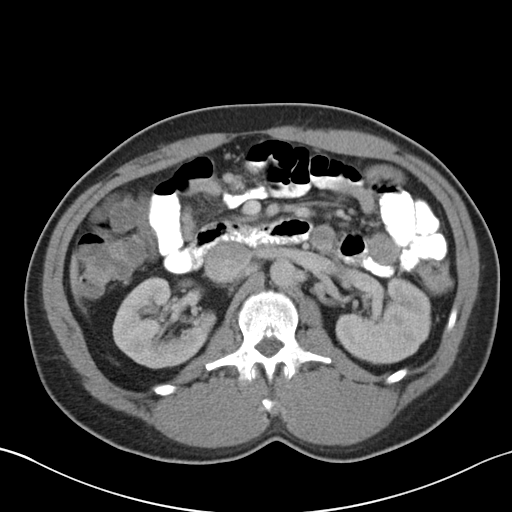
[im 67/101  bone]
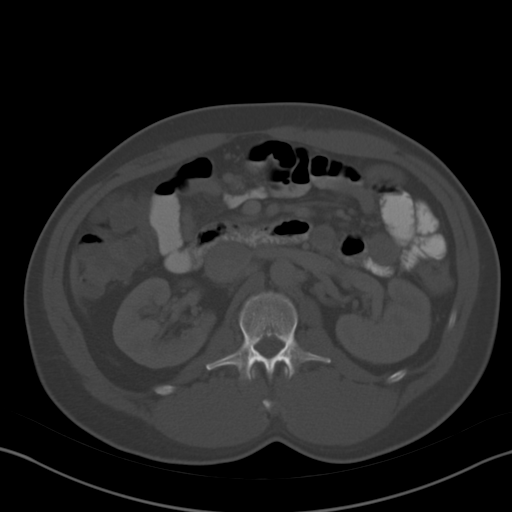
[im 73/101  soft-tissue]
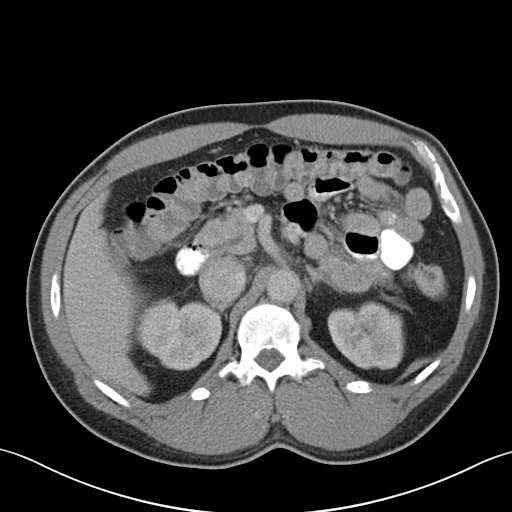
[im 78/101  soft-tissue]
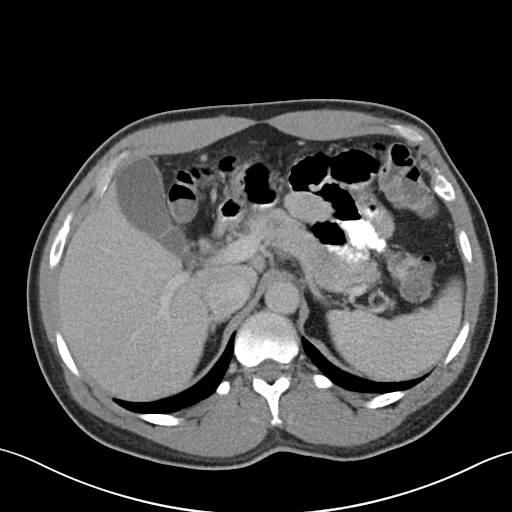
[im 78/101  lung]
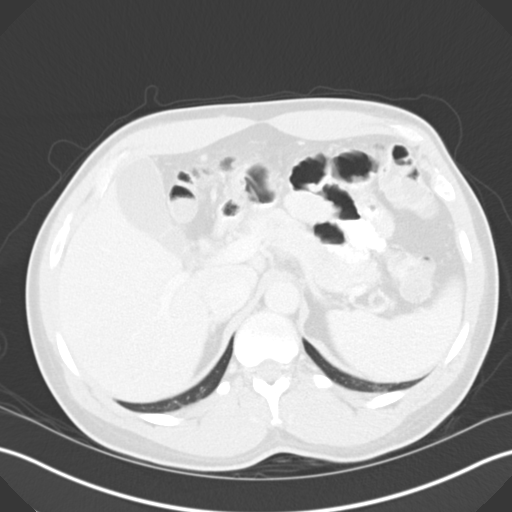
[im 84/101  lung]
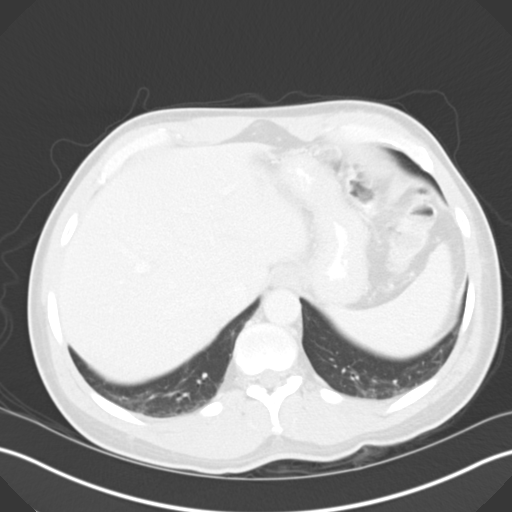
[im 89/101  soft-tissue]
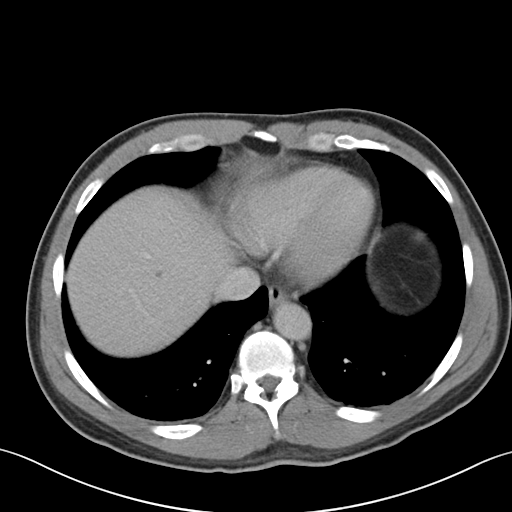
[im 89/101  lung]
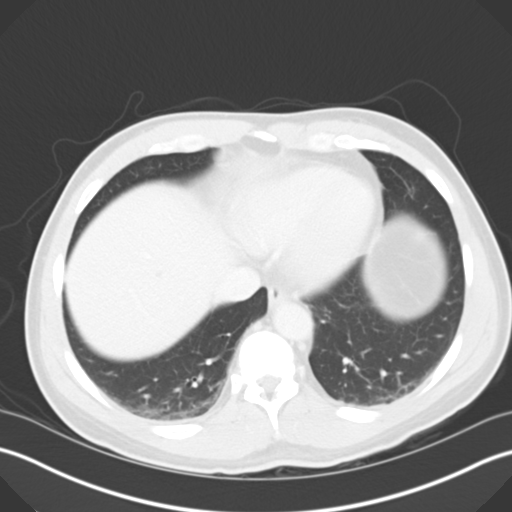
[im 95/101  soft-tissue]
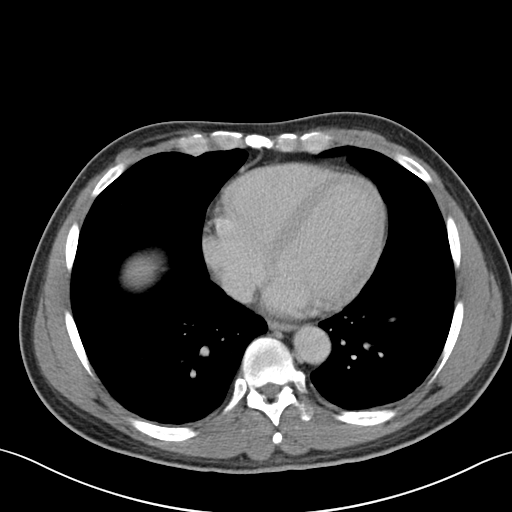
[im 95/101  lung]
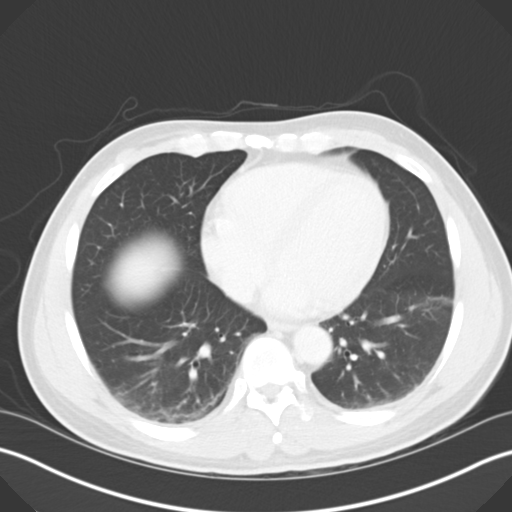

[14 of 32 positions shown; findings below may reference images not displayed]

FINDINGS: Lung bases are clear.  No pleural or pericardial fluid.

The liver has a normal appearance except for a 3 mm cyst at the
dome.  No calcified gallstones.  The spleen is normal.  The
pancreas is normal.  The adrenal glands are normal.  The kidneys
are normal except for a few punctate calculi..  No cyst, mass, or
hydronephrosis.  The aorta shows minimal atherosclerosis.  The IVC
is normal.  No retroperitoneal mass or adenopathy.  There is been
previous appendectomy.  There is mild diverticulosis of the colon
but no sign of diverticulitis.  No small bowel abnormality is seen.
Bladder, prostate gland and seminal vesicles are unremarkable.  No
significant bony finding.
IMPRESSION: No acute or significant finding.  No cause of the patient's
described symptoms is identified.

## 2015-03-25 ENCOUNTER — Emergency Department (HOSPITAL_COMMUNITY)
Admission: EM | Admit: 2015-03-25 | Discharge: 2015-03-26 | Disposition: A | Discharge status: Home / Self Care | Payer: Self-pay | Attending: Emergency Medicine | Admitting: Emergency Medicine

## 2015-03-25 ENCOUNTER — Encounter (HOSPITAL_COMMUNITY): Payer: Self-pay | Admitting: Vascular Surgery

## 2015-03-25 DIAGNOSIS — Z79899 Other long term (current) drug therapy: Secondary | ICD-10-CM | POA: Insufficient documentation

## 2015-03-25 DIAGNOSIS — Y998 Other external cause status: Secondary | ICD-10-CM | POA: Insufficient documentation

## 2015-03-25 DIAGNOSIS — Y92002 Bathroom of unspecified non-institutional (private) residence single-family (private) house as the place of occurrence of the external cause: Secondary | ICD-10-CM | POA: Insufficient documentation

## 2015-03-25 DIAGNOSIS — I10 Essential (primary) hypertension: Secondary | ICD-10-CM | POA: Insufficient documentation

## 2015-03-25 DIAGNOSIS — S3730XA Unspecified injury of urethra, initial encounter: Secondary | ICD-10-CM

## 2015-03-25 DIAGNOSIS — Y9301 Activity, walking, marching and hiking: Secondary | ICD-10-CM | POA: Insufficient documentation

## 2015-03-25 DIAGNOSIS — Z8669 Personal history of other diseases of the nervous system and sense organs: Secondary | ICD-10-CM | POA: Insufficient documentation

## 2015-03-25 LAB — URINALYSIS, ROUTINE W REFLEX MICROSCOPIC
BILIRUBIN URINE: NEGATIVE
GLUCOSE, UA: NEGATIVE mg/dL
KETONES UR: NEGATIVE mg/dL
Leukocytes, UA: NEGATIVE
Nitrite: NEGATIVE
PROTEIN: NEGATIVE mg/dL
Specific Gravity, Urine: 1.031 — ABNORMAL HIGH (ref 1.005–1.030)
pH: 5 (ref 5.0–8.0)

## 2015-03-25 LAB — URINE MICROSCOPIC-ADD ON

## 2015-03-25 MED ORDER — IBUPROFEN 800 MG PO TABS: 800.0000 mg | ORAL_TABLET | Freq: Three times a day (TID) | ORAL | Status: DC
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
## 2015-03-25 NOTE — Discharge Instructions (Signed)
Do NOT take aspirin until you have had your catheter taken out at the Urology office.

## 2015-03-26 ENCOUNTER — Emergency Department (HOSPITAL_COMMUNITY)
Admission: EM | Admit: 2015-03-26 | Discharge: 2015-03-26 | Disposition: A | Discharge status: Home / Self Care | Payer: Self-pay | Attending: Emergency Medicine | Admitting: Emergency Medicine

## 2015-03-26 ENCOUNTER — Encounter (HOSPITAL_COMMUNITY): Payer: Self-pay | Admitting: Emergency Medicine

## 2015-03-26 DIAGNOSIS — Z79899 Other long term (current) drug therapy: Secondary | ICD-10-CM | POA: Insufficient documentation

## 2015-03-26 DIAGNOSIS — Z960 Presence of urogenital implants: Secondary | ICD-10-CM

## 2015-03-26 DIAGNOSIS — I10 Essential (primary) hypertension: Secondary | ICD-10-CM | POA: Insufficient documentation

## 2015-03-26 DIAGNOSIS — Z978 Presence of other specified devices: Secondary | ICD-10-CM

## 2015-03-26 DIAGNOSIS — T83038A Leakage of other indwelling urethral catheter, initial encounter: Secondary | ICD-10-CM | POA: Insufficient documentation

## 2015-03-26 DIAGNOSIS — Z7982 Long term (current) use of aspirin: Secondary | ICD-10-CM | POA: Insufficient documentation

## 2015-03-26 DIAGNOSIS — Z8669 Personal history of other diseases of the nervous system and sense organs: Secondary | ICD-10-CM | POA: Insufficient documentation

## 2015-03-26 DIAGNOSIS — Z791 Long term (current) use of non-steroidal anti-inflammatories (NSAID): Secondary | ICD-10-CM | POA: Insufficient documentation

## 2015-03-26 DIAGNOSIS — Y658 Other specified misadventures during surgical and medical care: Secondary | ICD-10-CM | POA: Insufficient documentation

## 2015-03-26 MED ORDER — OXYCODONE-ACETAMINOPHEN 5-325 MG PO TABS: 1.0000 | ORAL_TABLET | ORAL | Status: DC | PRN

## 2015-03-26 MED ORDER — OXYCODONE-ACETAMINOPHEN 5-325 MG PO TABS
1.0000 | ORAL_TABLET | Freq: Once | ORAL | Status: AC
  Administered 2015-03-26: 1 via ORAL
  Filled 2015-03-26: qty 1

## 2015-03-26 NOTE — ED Notes (Signed)
Melton KrebsDeon Siler (fiance) [phone] Call when pt is ready to go home

## 2015-03-26 NOTE — Discharge Instructions (Signed)
Follow-up with your urologist next week for reevaluation. Your exam is reassuring Handyour urine does not show any evidence of infection.Take your pain medicine as we discussed that as prescribed. He may take Motrin or Tylenol for mild to moderate discomfort.

## 2015-03-26 NOTE — ED Notes (Signed)
Pt states foley catheter placed at [facility] at 0100 this morning. Upon assessment catheter size noted to be [address] [place]; see assessment for additional details.
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
## 2015-03-26 NOTE — ED Notes (Signed)
Bed: [id number] Expected date: 03/26/15 Expected time: 9:45 AM Means of arrival:  Comments: EMS, catheter problems
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
## 2015-03-26 NOTE — ED Notes (Signed)
Pt left with all his belongings and ambulated out of the treatment area.  

## 2015-03-27 LAB — URINE CULTURE: Culture: NO GROWTH

## 2015-03-28 ENCOUNTER — Encounter (HOSPITAL_COMMUNITY): Payer: Self-pay | Admitting: Emergency Medicine

## 2015-03-28 ENCOUNTER — Emergency Department (HOSPITAL_COMMUNITY)
Admission: EM | Admit: 2015-03-28 | Discharge: 2015-03-29 | Disposition: A | Discharge status: Home / Self Care | Payer: Self-pay | Attending: Emergency Medicine | Admitting: Emergency Medicine

## 2015-03-28 DIAGNOSIS — Z791 Long term (current) use of non-steroidal anti-inflammatories (NSAID): Secondary | ICD-10-CM | POA: Insufficient documentation

## 2015-03-28 DIAGNOSIS — I10 Essential (primary) hypertension: Secondary | ICD-10-CM | POA: Insufficient documentation

## 2015-03-28 DIAGNOSIS — Z960 Presence of urogenital implants: Secondary | ICD-10-CM

## 2015-03-28 DIAGNOSIS — Z79899 Other long term (current) drug therapy: Secondary | ICD-10-CM | POA: Insufficient documentation

## 2015-03-28 DIAGNOSIS — Z466 Encounter for fitting and adjustment of urinary device: Secondary | ICD-10-CM | POA: Insufficient documentation

## 2015-03-28 DIAGNOSIS — Z8669 Personal history of other diseases of the nervous system and sense organs: Secondary | ICD-10-CM | POA: Insufficient documentation

## 2015-03-28 LAB — CBC WITH DIFFERENTIAL/PLATELET
BASOS ABS: 0 10*3/uL (ref 0.0–0.1)
BASOS PCT: 0 %
EOS PCT: 1 %
Eosinophils Absolute: 0.1 10*3/uL (ref 0.0–0.7)
HCT: 43.1 % (ref 39.0–52.0)
Hemoglobin: 14.5 g/dL (ref 13.0–17.0)
Lymphocytes Relative: 13 %
Lymphs Abs: 1.2 10*3/uL (ref 0.7–4.0)
MCH: 28 pg (ref 26.0–34.0)
MCHC: 33.6 g/dL (ref 30.0–36.0)
MCV: 83.4 fL (ref 78.0–100.0)
MONO ABS: 0.9 10*3/uL (ref 0.1–1.0)
Monocytes Relative: 10 %
NEUTROS ABS: 6.9 10*3/uL (ref 1.7–7.7)
Neutrophils Relative %: 76 %
PLATELETS: 160 10*3/uL (ref 150–400)
RBC: 5.17 MIL/uL (ref 4.22–5.81)
RDW: 14.2 % (ref 11.5–15.5)
WBC: 9 10*3/uL (ref 4.0–10.5)

## 2015-03-28 LAB — COMPREHENSIVE METABOLIC PANEL
ALBUMIN: 4.6 g/dL (ref 3.5–5.0)
ALT: 16 U/L — ABNORMAL LOW (ref 17–63)
AST: 21 U/L (ref 15–41)
Alkaline Phosphatase: 53 U/L (ref 38–126)
Anion gap: 12 (ref 5–15)
BUN: 18 mg/dL (ref 6–20)
CHLORIDE: 105 mmol/L (ref 101–111)
CO2: 25 mmol/L (ref 22–32)
Calcium: 10 mg/dL (ref 8.9–10.3)
Creatinine, Ser: 1.37 mg/dL — ABNORMAL HIGH (ref 0.61–1.24)
GFR calc Af Amer: >60 mL/min (ref >60)
GFR calc non Af Amer: 58 mL/min — ABNORMAL LOW (ref >60)
GLUCOSE: 91 mg/dL (ref 65–99)
POTASSIUM: 3.6 mmol/L (ref 3.5–5.1)
Sodium: 142 mmol/L (ref 135–145)
Total Bilirubin: 1.7 mg/dL — ABNORMAL HIGH (ref 0.3–1.2)
Total Protein: 7.5 g/dL (ref 6.5–8.1)

## 2015-03-28 LAB — URINALYSIS, ROUTINE W REFLEX MICROSCOPIC
Bilirubin Urine: NEGATIVE
GLUCOSE, UA: NEGATIVE mg/dL
Ketones, ur: NEGATIVE mg/dL
Nitrite: NEGATIVE
PH: 5 (ref 5.0–8.0)
Protein, ur: 100 mg/dL — AB
SPECIFIC GRAVITY, URINE: 1.018 (ref 1.005–1.030)

## 2015-03-28 LAB — URINE MICROSCOPIC-ADD ON

## 2015-03-28 MED ORDER — OXYCODONE-ACETAMINOPHEN 5-325 MG PO TABS
1.0000 | ORAL_TABLET | Freq: Once | ORAL | Status: AC
  Administered 2015-03-28: 1 via ORAL

## 2015-03-28 MED ORDER — OXYCODONE-ACETAMINOPHEN 5-325 MG PO TABS
ORAL_TABLET | ORAL | Status: DC
Start: 2015-03-28 — End: 2015-03-29
  Filled 2015-03-28: qty 1

## 2015-03-28 NOTE — ED Notes (Signed)
Pt. reports pain at penis with discharge onset Friday , foley catheter placed here last week for urinary retention/penile injury .

## 2015-03-29 MED ORDER — CEPHALEXIN 250 MG PO CAPS
500.0000 mg | ORAL_CAPSULE | Freq: Once | ORAL | Status: AC
  Administered 2015-03-29: 500 mg via ORAL
  Filled 2015-03-29: qty 2

## 2015-03-29 MED ORDER — HYDROCODONE-ACETAMINOPHEN 5-325 MG PO TABS
1.0000 | ORAL_TABLET | Freq: Once | ORAL | Status: AC
  Administered 2015-03-29: 1 via ORAL
  Filled 2015-03-29: qty 1

## 2015-03-29 MED ORDER — PHENAZOPYRIDINE HCL 100 MG PO TABS
100.0000 mg | ORAL_TABLET | Freq: Once | ORAL | Status: AC
  Administered 2015-03-29: 100 mg via ORAL
  Filled 2015-03-29: qty 1

## 2015-03-29 MED ORDER — PHENAZOPYRIDINE HCL 200 MG PO TABS: 200.0000 mg | ORAL_TABLET | Freq: Three times a day (TID) | ORAL | Status: DC

## 2015-03-29 MED ORDER — CEPHALEXIN 500 MG PO CAPS: 500.0000 mg | ORAL_CAPSULE | Freq: Four times a day (QID) | ORAL | Status: DC

## 2015-03-29 NOTE — Discharge Instructions (Signed)
Is recommended to follow-up with a urologist to have the catheter removed, in case she would require additional procedures to ensure that the injury has healed.

## 2015-03-29 NOTE — ED Notes (Signed)
Pt stable, ambulatory, states understanding of discharge instructions 

## 2015-03-29 NOTE — ED Provider Notes (Signed)
CSN: [id number]     Arrival date & time 03/28/15  2020 History   First MD Initiated Contact with Patient 03/28/15 2321     Chief Complaint  Patient presents with  . Penis Pain  . Penile Discharge     HPI  Patient presents evaluating stating that his catheter hurts.  Patient had groin trauma and had an alleged/presumed refill tear with gross hematuria and blunt the glans. This was 4 days ago. Catheter is placed without difficulty. He states it is still painful you've sudden episodes of some severe pain. Still feels burning. Has urinary frequency. He states was "yellow stuff" coming around the catheter.  He is upset and states he is not able to be seen by urology because at first she states "they want $250 just to be seen there!".  I asked if he has any type of health insurance to help with his follow-up in the becomes very upset stating "it such a racist thing that he can't get health insurance or health care!"  Past Medical History  Diagnosis Date  . Hypertension   . Glaucoma    History reviewed. No pertinent past surgical history. No family history on file. Social History  Substance Use Topics  . Smoking status: Never Smoker   . Smokeless tobacco: None  . Alcohol Use: No    Review of Systems  Constitutional: Negative for fever, chills, diaphoresis, appetite change and fatigue.  HENT: Negative for mouth sores, sore throat and trouble swallowing.   Eyes: Negative for visual disturbance.  Respiratory: Negative for cough, chest tightness, shortness of breath and wheezing.   Cardiovascular: Negative for chest pain.  Gastrointestinal: Negative for nausea, vomiting, abdominal pain, diarrhea and abdominal distention.  Endocrine: Negative for polydipsia, polyphagia and polyuria.  Genitourinary: Positive for discharge and penile pain. Negative for dysuria, frequency and hematuria.  Musculoskeletal: Negative for gait problem.  Skin: Negative for color change, pallor and rash.    Neurological: Negative for dizziness, syncope, light-headedness and headaches.  Hematological: Does not bruise/bleed easily.  Psychiatric/Behavioral: Negative for behavioral problems and confusion.      Allergies  Review of patient's allergies indicates no known allergies.  Home Medications   Prior to Admission medications   Medication Sig Start Date End Date Taking? Authorizing Provider  acetaminophen (TYLENOL 8 HOUR) 650 MG CR tablet Take 1 tablet (650 mg total) by mouth every 8 (eight) hours as needed for pain. Patient taking differently: Take 650 mg by mouth every 8 (eight) hours as needed for pain.  10/09/13   Kaitlyn Szekalski, PA-C  cephALEXin (KEFLEX) 500 MG capsule Take 1 capsule (500 mg total) by mouth 4 (four) times daily. 03/29/15   Rolland Porter, MD  ibuprofen (ADVIL,MOTRIN) 800 MG tablet Take 1 tablet (800 mg total) by mouth 3 (three) times daily. 03/25/15   Eber Hong, MD  lisinopril-hydrochlorothiazide (PRINZIDE,ZESTORETIC) 20-25 MG tablet Take 2 tablets by mouth daily.     Historical Provider, MD  oxyCODONE-acetaminophen (PERCOCET/ROXICET) 5-325 MG tablet Take 1 tablet by mouth every 4 (four) hours as needed for severe pain. 03/26/15   Joycie Peek, PA-C  phenazopyridine (PYRIDIUM) 200 MG tablet Take 1 tablet (200 mg total) by mouth 3 (three) times daily. 03/29/15   Rolland Porter, MD  promethazine (PHENERGAN) 25 MG tablet Take 1 tablet (25 mg total) by mouth every 6 (six) hours as needed for nausea or vomiting. Patient not taking: Reported on 03/26/2015 10/09/13   Emilia Beck, PA-C   BP 136/82 mmHg  Pulse 82  Temp(Src) 98.6 F (37 C) (Oral)  Resp 18  Ht 5\' 11"  (1.803 m)  Wt 202 lb (91.627 kg)  BMI 28.19 kg/m2  SpO2 97% Physical Exam  Constitutional: He is oriented to person, place, and time. He appears well-developed and well-nourished. No distress.  HENT:  Head: Normocephalic.  Eyes: Conjunctivae are normal. Pupils are equal, round, and reactive to light. No  scleral icterus.  Neck: Normal range of motion. Neck supple. No thyromegaly present.  Cardiovascular: Normal rate and regular rhythm.  Exam reveals no gallop and no friction rub.   No murmur heard. Pulmonary/Chest: Effort normal and breath sounds normal. No respiratory distress. He has no wheezes. He has no rales.  Abdominal: Soft. Bowel sounds are normal. He exhibits no distension. There is no tenderness. There is no rebound.  Genitourinary:     Musculoskeletal: Normal range of motion.  Neurological: He is alert and oriented to person, place, and time.  Skin: Skin is warm and dry. No rash noted.  Psychiatric: He has a normal mood and affect. His behavior is normal.    ED Course  Procedures (including critical care time) Labs Review Labs Reviewed  COMPREHENSIVE METABOLIC PANEL - Abnormal; Notable for the following:    Creatinine, Ser 1.37 (*)    ALT 16 (*)    Total Bilirubin 1.7 (*)    GFR calc non Af Amer 58 (*)    All other components within normal limits  URINALYSIS, ROUTINE W REFLEX MICROSCOPIC (NOT AT [facility]) - Abnormal; Notable for the following:    Color, Urine AMBER (*)    APPearance CLOUDY (*)    Hgb urine dipstick LARGE (*)    Protein, ur 100 (*)    Leukocytes, UA SMALL (*)    All other components within normal limits  URINE MICROSCOPIC-ADD ON - Abnormal; Notable for the following:    Squamous Epithelial / LPF 0-5 (*)    Bacteria, UA FEW (*)    Casts HYALINE CASTS (*)    All other components within normal limits  CBC WITH DIFFERENTIAL/PLATELET    Imaging Review No results found. I have personally reviewed and evaluated these images and lab results as part of my medical decision-making.   EKG Interpretation None      MDM   Final diagnoses:  Urinary catheter in place    I assume that his drainage is very likely serous drainage from his healing urethral injury. I recommended not removing the catheter at this time. We'll cover him with Keflex to ensure that  this is not infection. Given Pyridium for his catheter pain and frequency and urgency. I have recommended urological follow-up.    Rolland PorterMark Dhyana Bastone, MD 03/29/15 [id number]

## 2015-04-30 ENCOUNTER — Emergency Department (HOSPITAL_COMMUNITY)
Admission: EM | Admit: 2015-04-30 | Discharge: 2015-04-30 | Disposition: A | Discharge status: Home / Self Care | Payer: Self-pay | Attending: Emergency Medicine | Admitting: Emergency Medicine

## 2015-04-30 ENCOUNTER — Encounter (HOSPITAL_COMMUNITY): Payer: Self-pay

## 2015-04-30 DIAGNOSIS — I10 Essential (primary) hypertension: Secondary | ICD-10-CM | POA: Insufficient documentation

## 2015-04-30 DIAGNOSIS — L0231 Cutaneous abscess of buttock: Secondary | ICD-10-CM | POA: Insufficient documentation

## 2015-04-30 DIAGNOSIS — Z791 Long term (current) use of non-steroidal anti-inflammatories (NSAID): Secondary | ICD-10-CM | POA: Insufficient documentation

## 2015-04-30 DIAGNOSIS — Z8669 Personal history of other diseases of the nervous system and sense organs: Secondary | ICD-10-CM | POA: Insufficient documentation

## 2015-04-30 DIAGNOSIS — L03317 Cellulitis of buttock: Secondary | ICD-10-CM | POA: Insufficient documentation

## 2015-04-30 DIAGNOSIS — Z79899 Other long term (current) drug therapy: Secondary | ICD-10-CM | POA: Insufficient documentation

## 2015-04-30 DIAGNOSIS — Z792 Long term (current) use of antibiotics: Secondary | ICD-10-CM | POA: Insufficient documentation

## 2015-04-30 MED ORDER — SULFAMETHOXAZOLE-TRIMETHOPRIM 800-160 MG PO TABS
1.0000 | ORAL_TABLET | Freq: Two times a day (BID) | ORAL | Status: AC
Start: 2015-04-30 — End: 2015-05-07

## 2015-04-30 MED ORDER — IBUPROFEN 800 MG PO TABS: 800.0000 mg | ORAL_TABLET | Freq: Three times a day (TID) | ORAL | Status: DC | PRN

## 2015-04-30 MED ORDER — LIDOCAINE-EPINEPHRINE 2 %-1:100000 IJ SOLN
20.0000 mL | Freq: Once | INTRAMUSCULAR | Status: DC
  Filled 2015-04-30: qty 1

## 2015-04-30 MED ORDER — IBUPROFEN 800 MG PO TABS
800.0000 mg | ORAL_TABLET | Freq: Once | ORAL | Status: AC
  Administered 2015-04-30: 800 mg via ORAL
  Filled 2015-04-30: qty 1

## 2015-04-30 MED ORDER — HYDROCODONE-ACETAMINOPHEN 5-325 MG PO TABS: 1.0000 | ORAL_TABLET | Freq: Four times a day (QID) | ORAL | Status: DC | PRN

## 2015-04-30 MED ORDER — SULFAMETHOXAZOLE-TRIMETHOPRIM 800-160 MG PO TABS
1.0000 | ORAL_TABLET | Freq: Once | ORAL | Status: AC
  Administered 2015-04-30: 1 via ORAL
  Filled 2015-04-30: qty 1

## 2015-04-30 NOTE — Discharge Instructions (Signed)
Abscess °An abscess is an infected area that contains a collection of pus and debris. It can occur in almost any part of the body. An abscess is also known as a furuncle or boil. °CAUSES  °An abscess occurs when tissue gets infected. This can occur from blockage of oil or sweat glands, infection of hair follicles, or a minor injury to the skin. As the body tries to fight the infection, pus collects in the area and creates pressure under the skin. This pressure causes pain. People with weakened immune systems have difficulty fighting infections and get certain abscesses more often.  °SYMPTOMS °Usually an abscess develops on the skin and becomes a painful mass that is red, warm, and tender. If the abscess forms under the skin, you may feel a moveable soft area under the skin. Some abscesses break open (rupture) on their own, but most will continue to get worse without care. The infection can spread deeper into the body and eventually into the bloodstream, causing you to feel ill.  °DIAGNOSIS  °Your caregiver will take your medical history and perform a physical exam. A sample of fluid may also be taken from the abscess to determine what is causing your infection. °TREATMENT  °Your caregiver may prescribe antibiotic medicines to fight the infection. However, taking antibiotics alone usually does not cure an abscess. Your caregiver may need to make a small cut (incision) in the abscess to drain the pus. In some cases, gauze is packed into the abscess to reduce pain and to continue draining the area. °HOME CARE INSTRUCTIONS  °· Only take over-the-counter or prescription medicines for pain, discomfort, or fever as directed by your caregiver. °· If you were prescribed antibiotics, take them as directed. Finish them even if you start to feel better. °· If gauze is used, follow your caregiver's directions for changing the gauze. °· To avoid spreading the infection: °· Keep your draining abscess covered with a  bandage. °· Wash your hands well. °· Do not share personal care items, towels, or whirlpools with others. °· Avoid skin contact with others. °· Keep your skin and clothes clean around the abscess. °· Keep all follow-up appointments as directed by your caregiver. °SEEK MEDICAL CARE IF:  °· You have increased pain, swelling, redness, fluid drainage, or bleeding. °· You have muscle aches, chills, or a general ill feeling. °· You have a fever. °MAKE SURE YOU:  °· Understand these instructions. °· Will watch your condition. °· Will get help right away if you are not doing well or get worse. °  °This information is not intended to replace advice given to you by your health care provider. Make sure you discuss any questions you have with your health care provider. °  °Document Released: 10/11/2004 Document Revised: 07/03/2011 Document Reviewed: 03/16/2011 °Elsevier Interactive Patient Education ©2016 Elsevier Inc. ° °Cellulitis °Cellulitis is an infection of the skin and the tissue beneath it. The infected area is usually red and tender. Cellulitis occurs most often in the arms and lower legs.  °CAUSES  °Cellulitis is caused by bacteria that enter the skin through cracks or cuts in the skin. The most common types of bacteria that cause cellulitis are staphylococci and streptococci. °SIGNS AND SYMPTOMS  °· Redness and warmth. °· Swelling. °· Tenderness or pain. °· Fever. °DIAGNOSIS  °Your health care provider can usually determine what is wrong based on a physical exam. Blood tests may also be done. °TREATMENT  °Treatment usually involves taking an antibiotic medicine. °HOME CARE INSTRUCTIONS  °·   Take your antibiotic medicine as directed by your health care provider. Finish the antibiotic even if you start to feel better.  Keep the infected arm or leg elevated to reduce swelling.  Apply a warm cloth to the affected area up to 4 times per day to relieve pain.  Take medicines only as directed by your health care  provider.  Keep all follow-up visits as directed by your health care provider. SEEK MEDICAL CARE IF:   You notice red streaks coming from the infected area.  Your red area gets larger or turns dark in color.  Your bone or joint underneath the infected area becomes painful after the skin has healed.  Your infection returns in the same area or another area.  You notice a swollen bump in the infected area.  You develop new symptoms.  You have a fever. SEEK IMMEDIATE MEDICAL CARE IF:   You feel very sleepy.  You develop vomiting or diarrhea.  You have a general ill feeling (malaise) with muscle aches and pains.   This information is not intended to replace advice given to you by your health care provider. Make sure you discuss any questions you have with your health care provider.   Document Released: 10/11/2004 Document Revised: 09/22/2014 Document Reviewed: 03/19/2011 Elsevier Interactive Patient Education [employer]2016 Elsevier Inc.   To find a primary care or specialty doctor please call [phone] or [phone] to access "[place] Find a Doctor Service."  You may also go on the [facility] website at [email or web address]  There are also multiple [facility] practices throughout the [place] that are frequently accepting new patients. You may find a clinic that is close to your home and contact them.  [facility] -  [address] [place] [phone]  [facility] in [place] (also locations in [place] and [place]) -  [address] [place] [address] [phone]  [facility] -  [address] [place] [address] [phone]

## 2015-04-30 NOTE — ED Provider Notes (Signed)
By signing my name below, I, Evon Slack, attest that this documentation has been prepared under the direction and in the presence of [employer], DO. Electronically Signed: Evon Slack, ED Scribe. 04/30/2015. 1:50 AM.  TIME SEEN: 1:41 AM   CHIEF COMPLAINT: Abscess  HPI: HPI Comments: Jon Green is a 50 y.o. male with HTN who presents to the Emergency Department complaining of abscess to his right buttock that he noticed today 6 PM. Pt states that he believes that he was bit by a spider. Pt states that he is staying at a friends house and used the bathroom and felt pain in his right buttock.  Did not see any insect. Pt does have associated redness around the area.  No drainage.  No fever. Pt doesn't report any treatments tried PTA.   ROS: See HPI Constitutional: no fever  Eyes: no drainage  ENT: no runny nose   Cardiovascular:  no chest pain  Resp: no SOB  GI: no vomiting GU: no dysuria Integumentary: no rash  Allergy: no hives  Musculoskeletal: no leg swelling  Neurological: no slurred speech ROS otherwise negative  PAST MEDICAL HISTORY/PAST SURGICAL HISTORY:  Past Medical History  Diagnosis Date  . Hypertension   . Glaucoma     MEDICATIONS:  Prior to Admission medications   Medication Sig Start Date End Date Taking? Authorizing Provider  acetaminophen (TYLENOL 8 HOUR) 650 MG CR tablet Take 1 tablet (650 mg total) by mouth every 8 (eight) hours as needed for pain. Patient taking differently: Take 650 mg by mouth every 8 (eight) hours as needed for pain.  10/09/13   Kaitlyn Szekalski, PA-C  cephALEXin (KEFLEX) 500 MG capsule Take 1 capsule (500 mg total) by mouth 4 (four) times daily. 03/29/15   Rolland Porter, MD  ibuprofen (ADVIL,MOTRIN) 800 MG tablet Take 1 tablet (800 mg total) by mouth 3 (three) times daily. 03/25/15   Eber Hong, MD  lisinopril-hydrochlorothiazide (PRINZIDE,ZESTORETIC) 20-25 MG tablet Take 2 tablets by mouth daily.     Historical Provider, MD   oxyCODONE-acetaminophen (PERCOCET/ROXICET) 5-325 MG tablet Take 1 tablet by mouth every 4 (four) hours as needed for severe pain. 03/26/15   Joycie Peek, PA-C  phenazopyridine (PYRIDIUM) 200 MG tablet Take 1 tablet (200 mg total) by mouth 3 (three) times daily. 03/29/15   Rolland Porter, MD  promethazine (PHENERGAN) 25 MG tablet Take 1 tablet (25 mg total) by mouth every 6 (six) hours as needed for nausea or vomiting. Patient not taking: Reported on 03/26/2015 10/09/13   Emilia Beck, PA-C    ALLERGIES:  No Known Allergies  SOCIAL HISTORY:  Social History  Substance Use Topics  . Smoking status: Never Smoker   . Smokeless tobacco: Not on file  . Alcohol Use: No    FAMILY HISTORY: History reviewed. No pertinent family history.  EXAM: BP 124/78 mmHg  Pulse 75  Temp(Src) 98.3 F (36.8 C) (Oral)  Resp 18  Ht  (1.803 m)  Wt 236 lb 8 oz (107.276 kg)  BMI 33.00 kg/m2  SpO2 98%   CONSTITUTIONAL: Alert and oriented and responds appropriately to questions. Well-appearing; well-nourished HEAD: Normocephalic EYES: Conjunctivae clear, PERRL ENT: normal nose; no rhinorrhea; moist mucous membranes NECK: Supple, no meningismus, no LAD  CARD: RRR; S1 and S2 appreciated; no murmurs, no clicks, no rubs, no gallops RESP: Normal chest excursion without splinting or tachypnea; breath sounds clear and equal bilaterally; no wheezes, no rhonchi, no rales, no hypoxia or respiratory distress, speaking full sentences ABD/GI:  Normal bowel sounds; non-distended; soft, non-tender, no rebound, no guarding, no peritoneal signs BACK:  The back appears normal and is non-tender to palpation, there is no CVA tenderness EXT: Normal ROM in all joints; non-tender to palpation; no edema; normal capillary refill; no cyanosis, no calf tenderness or swelling    SKIN: Normal color for age and race; warm; no rash other than 3x4 cm erythematous, warm, indurated, tender area to the right buttock with another 1-2  cm surrounding erythema and warmth without fluctuance or drainage  NEURO: Moves all extremities equally, sensation to light touch intact diffusely, cranial nerves II through XII intact PSYCH: The patient's mood and manner are appropriate. Grooming and personal hygiene are appropriate.  MEDICAL DECISION MAKING: Patient here with an abscess to the right buttock with surrounding cellulitis. No sign of rectal involvement. He is afebrile, hemodynamically stable. Not a diabetic or immunocompromised. If that he thinks that an insect bit him but did not see any insect. There is no sign of necrosis.  Bedside ultrasound shows only a small amount of fluid. I have incised and drained this area with minimal purulent drainage. I have opened the area broken up any loculations. Given he does have surrounding cellulitis we have given him Bactrim and will discharge with prescription for the same. We'll discharge with ibuprofen and a small short course of Vicodin to take as needed. I recommended outpatient follow-up if it is not improving in the next 3 days.  At this time, I do not feel there is any life-threatening condition present. I have reviewed and discussed all results (EKG, imaging, lab, urine as appropriate), exam findings with patient. I have reviewed nursing notes and appropriate previous records.  I feel the patient is safe to be discharged home without further emergent workup. Discussed usual and customary return precautions. Patient and family (if present) verbalize understanding and are comfortable with this plan.  Patient will follow-up with their primary care provider. If they do not have a primary care provider, information for follow-up has been provided to them. All questions have been answered.     INCISION AND DRAINAGE Performed by: Raelyn NumberWARD, Minette Manders N Consent: Verbal consent obtained. Risks and benefits: risks, benefits and alternatives were discussed Type: abscess  Body area: Right  buttock  Anesthesia: local infiltration  Incision was made with a scalpel.  Local anesthetic: lidocaine 2 % with epinephrine  Anesthetic total: 8 ml  Complexity: complex Blunt dissection to break up loculations  Drainage: purulent  Drainage amount: Small   Packing material: None   Patient tolerance: Patient tolerated the procedure well with no immediate complications.       I personally performed the services described in this documentation, which was scribed in my presence. The recorded information has been reviewed and is accurate.    Layla MawKristen N Shayanna Thatch, DO 04/30/15 [phone]

## 2015-04-30 NOTE — ED Notes (Addendum)
Pt presents with a redness, inflammation, and 9/10 pain to the right side of his buttocks. Pt believes he was bitten by an insect specifically a spider b/c he felt an initial "bite type" pain while urinating after dropping his pants and the tenants of the home he was visiting stated to him they have had issues with spiders. Pt A+OX4 and ambulatory.

## 2015-04-30 NOTE — ED Notes (Signed)
MD at bedside. 

## 2015-08-19 ENCOUNTER — Encounter (HOSPITAL_COMMUNITY): Payer: Self-pay | Admitting: Emergency Medicine

## 2015-08-19 ENCOUNTER — Emergency Department (HOSPITAL_COMMUNITY)
Admission: EM | Admit: 2015-08-19 | Discharge: 2015-08-19 | Disposition: A | Discharge status: Home / Self Care | Payer: Self-pay | Attending: Emergency Medicine | Admitting: Emergency Medicine

## 2015-08-19 DIAGNOSIS — I10 Essential (primary) hypertension: Secondary | ICD-10-CM | POA: Insufficient documentation

## 2015-08-19 DIAGNOSIS — L03113 Cellulitis of right upper limb: Secondary | ICD-10-CM

## 2015-08-19 DIAGNOSIS — Z79899 Other long term (current) drug therapy: Secondary | ICD-10-CM | POA: Insufficient documentation

## 2015-08-19 DIAGNOSIS — L03114 Cellulitis of left upper limb: Secondary | ICD-10-CM | POA: Insufficient documentation

## 2015-08-19 MED ORDER — CLINDAMYCIN HCL 150 MG PO CAPS: 300.0000 mg | ORAL_CAPSULE | Freq: Four times a day (QID) | ORAL | 0 refills | Status: DC

## 2015-08-19 MED ORDER — HYDROCODONE-ACETAMINOPHEN 5-325 MG PO TABS: 1.0000 | ORAL_TABLET | Freq: Four times a day (QID) | ORAL | 0 refills | Status: DC | PRN

## 2015-08-19 MED ORDER — OXYCODONE-ACETAMINOPHEN 5-325 MG PO TABS
1.0000 | ORAL_TABLET | Freq: Once | ORAL | Status: AC
  Administered 2015-08-19: 1 via ORAL
  Filled 2015-08-19: qty 1

## 2015-08-19 MED ORDER — CLINDAMYCIN HCL 300 MG PO CAPS
450.0000 mg | ORAL_CAPSULE | Freq: Once | ORAL | Status: AC
  Administered 2015-08-19: 450 mg via ORAL
  Filled 2015-08-19: qty 1

## 2015-08-19 NOTE — ED Triage Notes (Signed)
Pt from home with what he thinks may be a bug bite on his right elbow. Pt states he was in the woods the day before yesterday. He did not feel a sting or bite. Pt just thinks that may have been the time he might have been bitten if it was a bug bite.  The area is red and swollen. Pt states the pain and swelling began this morning.

## 2015-08-19 NOTE — Discharge Instructions (Signed)
Please take antibiotics as directed, please follow-up in 2 days for reevaluation, please return sooner if you experience new or worsening signs or symptoms.

## 2015-08-19 NOTE — ED Provider Notes (Signed)
[facility]-EMERGENCY DEPT Provider Note   CSN: [id number] Arrival date & time: 08/19/15  1910  First Provider Contact:   First MD Initiated Contact with Patient 08/19/15 2155     By signing my name below, I, Jon Green, attest that this documentation has been prepared under the direction and in the presence of [employer], PA-C.  Electronically Signed: Rosario Green, ED Scribe. 08/19/15. 10:05 PM.  History   Chief Complaint Chief Complaint  Patient presents with  . Rash   The history is provided by the patient and a friend. No language interpreter was used.   HPI Comments: Jon Green is a 50 y.o. male with a PMHx of glaucoma and HTN, who presents to the Emergency Department complaining of gradual onset, gradually worsening, constant, moderate area of pain, redness, and swelling to the right elbow onset this AM PTA. Pt reports that he was walking in a heavily wooded area ~2-3 days PTA, where he may have been bitten by something; however did not see anything bite him or on his skin. No hx of infections similar or otherwise. No alleviating factors/home remedies tried PTA. Denies fever, or any other associated symptoms. Full active range of motion of the elbow.   Past Medical History:  Diagnosis Date  . Glaucoma   . Hypertension    There are no active problems to display for this patient.  Past Surgical History:  Procedure Laterality Date  . Right Foot Surgery      Home Medications    Prior to Admission medications   Medication Sig Start Date End Date Taking? Authorizing Provider  hydrochlorothiazide (HYDRODIURIL) 25 MG tablet Take 25 mg by mouth daily.   Yes Historical Provider, MD  lisinopril (PRINIVIL,ZESTRIL) 20 MG tablet Take 20 mg by mouth daily.   Yes Historical Provider, MD  timolol (BETIMOL) 0.5 % ophthalmic solution Place 1 drop into both eyes 2 (two) times daily.   Yes Historical Provider, MD  clindamycin (CLEOCIN) 150 MG capsule Take 2 capsules (300 mg  total) by mouth 4 (four) times daily. 08/19/15   Eyvonne Mechanic, PA-C  HYDROcodone-acetaminophen (NORCO/VICODIN) 5-325 MG tablet Take 1 tablet by mouth every 6 (six) hours as needed. 08/19/15   Eyvonne Mechanic, PA-C  ibuprofen (ADVIL,MOTRIN) 800 MG tablet Take 1 tablet (800 mg total) by mouth every 8 (eight) hours as needed for mild pain. Patient not taking: Reported on 08/19/2015 04/30/15   Layla Maw Ward, DO   Family History No family history on file.  Social History Social History  Substance Use Topics  . Smoking status: Never Smoker  . Smokeless tobacco: Never Used  . Alcohol use No   Allergies   Review of patient's allergies indicates no known allergies.  Review of Systems Review of Systems  Constitutional: Negative for fever.  Musculoskeletal: Positive for myalgias.  Skin: Positive for rash (right elbow).  Neurological: Negative for numbness.  Psychiatric/Behavioral: Negative for confusion.  All other systems reviewed and are negative.  Physical Exam Updated Vital Signs BP 140/94   Pulse 70   Temp 98.3 F (36.8 C)   Resp 17   Ht 5\' 11"  (1.803 m)   Wt 107 kg   SpO2 97%   BMI 32.92 kg/m   Physical Exam  Constitutional: He is oriented to person, place, and time. He appears well-developed and well-nourished.  HENT:  Head: Normocephalic and atraumatic.  Eyes: Conjunctivae are normal. Pupils are equal, round, and reactive to light. Right eye exhibits no discharge. Left eye exhibits  no discharge. No scleral icterus.  Neck: Normal range of motion. No JVD present. No tracheal deviation present.  Pulmonary/Chest: Effort normal. No stridor.  Neurological: He is alert and oriented to person, place, and time. Coordination normal.  Skin: Skin is warm. There is erythema.  Redness, warmth to touch, and swelling to the right elbow over the olecranon process. Full ROM of the elbow. Induration noted, no obvious drainable abscess  Psychiatric: He has a normal mood and affect. His behavior  is normal. Judgment and thought content normal.  Nursing note and vitals reviewed.  ED Treatments / Results  DIAGNOSTIC STUDIES: Oxygen Saturation is 100% on RA, normal by my interpretation.   COORDINATION OF CARE: 10:00 PM-Discussed next steps with pt. Pt verbalized understanding and is agreeable with the plan.   Labs (all labs ordered are listed, but only abnormal results are displayed) Labs Reviewed - No data to display  EKG  EKG Interpretation None      Radiology No results found.  Procedures Procedures (including critical care time)  Medications Ordered in ED Medications  oxyCODONE-acetaminophen (PERCOCET/ROXICET) 5-325 MG per tablet 1 tablet (1 tablet Oral Given 08/19/15 2231)  clindamycin (CLEOCIN) capsule 450 mg (450 mg Oral Given 08/19/15 2301)     Initial Impression / Assessment and Plan / ED Course  I have reviewed the triage vital signs and the nursing notes.  Pertinent labs & imaging results that were available during my care of the patient were reviewed by me and considered in my medical decision making (see chart for details).  Clinical Course    Labs:   Imaging: [place] right elbow  Consults:  Therapeutics: Percocet, Clindamycin   Discharge Meds:   Assessment/Plan:  50 year old male presents today with cellulitis. Patient has cellulitis over his elbow, induration, but no obvious drainable abscess. Patient has full active range of motion, low suspicion for septic arthritis, at this time no indication for drainage or aspiration, patient will be given a dose of antibiotics in the ED as he is afebrile and nontoxic. He will need close follow-up. Patient does not have a primary care provider, he is instructed take antibioticsas directed, return to the emergency room in 2 days for reevaluation. Patient is instructed return immediately if symptoms dramatically worsen, or he experiences any new concerning signs or symptoms. Patient was extensively counseled on  return precautions, he seems very reasonable and assured his follow-up evaluation. Patient had no further questions or concerns at time of discharge.   Final Clinical Impressions(s) / ED Diagnoses    Final diagnoses:  Cellulitis of right upper extremity    New Prescriptions New Prescriptions   CLINDAMYCIN (CLEOCIN) 150 MG CAPSULE    Take 2 capsules (300 mg total) by mouth 4 (four) times daily.   HYDROCODONE-ACETAMINOPHEN (NORCO/VICODIN) 5-325 MG TABLET    Take 1 tablet by mouth every 6 (six) hours as needed.   I personally performed the services described in this documentation, which was scribed in my presence. The recorded information has been reviewed and is  accurate.    Eyvonne Mechanic, PA-C 08/19/15 8295    Alvira Monday, MD 08/20/15 1253

## 2015-08-22 ENCOUNTER — Emergency Department (HOSPITAL_COMMUNITY)
Admission: EM | Admit: 2015-08-22 | Discharge: 2015-08-22 | Disposition: A | Discharge status: Home / Self Care | Payer: Self-pay | Attending: Emergency Medicine | Admitting: Emergency Medicine

## 2015-08-22 ENCOUNTER — Encounter (HOSPITAL_COMMUNITY): Payer: Self-pay

## 2015-08-22 DIAGNOSIS — L0291 Cutaneous abscess, unspecified: Secondary | ICD-10-CM

## 2015-08-22 DIAGNOSIS — L02413 Cutaneous abscess of right upper limb: Secondary | ICD-10-CM | POA: Insufficient documentation

## 2015-08-22 DIAGNOSIS — Z79899 Other long term (current) drug therapy: Secondary | ICD-10-CM | POA: Insufficient documentation

## 2015-08-22 DIAGNOSIS — I10 Essential (primary) hypertension: Secondary | ICD-10-CM | POA: Insufficient documentation

## 2015-08-22 DIAGNOSIS — L0390 Cellulitis, unspecified: Secondary | ICD-10-CM

## 2015-08-22 LAB — CBC WITH DIFFERENTIAL/PLATELET
BASOS ABS: 0 10*3/uL (ref 0.0–0.1)
Basophils Relative: 0 %
Eosinophils Absolute: 0.1 10*3/uL (ref 0.0–0.7)
Eosinophils Relative: 2 %
HEMATOCRIT: 42 % (ref 39.0–52.0)
HEMOGLOBIN: 14.3 g/dL (ref 13.0–17.0)
LYMPHS PCT: 21 %
Lymphs Abs: 1.4 10*3/uL (ref 0.7–4.0)
MCH: 28.5 pg (ref 26.0–34.0)
MCHC: 34 g/dL (ref 30.0–36.0)
MCV: 83.7 fL (ref 78.0–100.0)
MONO ABS: 0.6 10*3/uL (ref 0.1–1.0)
Monocytes Relative: 9 %
NEUTROS ABS: 4.6 10*3/uL (ref 1.7–7.7)
NEUTROS PCT: 68 %
Platelets: 167 10*3/uL (ref 150–400)
RBC: 5.02 MIL/uL (ref 4.22–5.81)
RDW: 14.7 % (ref 11.5–15.5)
WBC: 6.8 10*3/uL (ref 4.0–10.5)

## 2015-08-22 LAB — BASIC METABOLIC PANEL
ANION GAP: 5 (ref 5–15)
BUN: 11 mg/dL (ref 6–20)
CALCIUM: 8.9 mg/dL (ref 8.9–10.3)
CHLORIDE: 107 mmol/L (ref 101–111)
CO2: 28 mmol/L (ref 22–32)
Creatinine, Ser: 0.97 mg/dL (ref 0.61–1.24)
GFR calc non Af Amer: >60 mL/min (ref >60)
GLUCOSE: 72 mg/dL (ref 65–99)
POTASSIUM: 3.8 mmol/L (ref 3.5–5.1)
Sodium: 140 mmol/L (ref 135–145)

## 2015-08-22 MED ORDER — VANCOMYCIN HCL IN DEXTROSE 1-5 GM/200ML-% IV SOLN
1000.0000 mg | Freq: Once | INTRAVENOUS | Status: AC
  Administered 2015-08-22: 1000 mg via INTRAVENOUS
  Filled 2015-08-22: qty 200

## 2015-08-22 MED ORDER — LIDOCAINE-EPINEPHRINE 2 %-1:100000 IJ SOLN
20.0000 mL | Freq: Once | INTRAMUSCULAR | Status: AC
  Administered 2015-08-22: 20 mL
  Filled 2015-08-22: qty 1

## 2015-08-22 MED ORDER — OXYCODONE-ACETAMINOPHEN 5-325 MG PO TABS
1.0000 | ORAL_TABLET | ORAL | Status: DC | PRN
  Administered 2015-08-22: 1 via ORAL
  Filled 2015-08-22: qty 1

## 2015-08-22 MED ORDER — HYDROCODONE-ACETAMINOPHEN 5-325 MG PO TABS: 1.0000 | ORAL_TABLET | Freq: Four times a day (QID) | ORAL | 0 refills | Status: DC | PRN

## 2015-08-22 NOTE — ED Triage Notes (Signed)
Pt was seen here for right elbow insect bite 8/4 and was instructed to return for wound check. Right elbow appears reddened, swollen, and hot to touch. Pt reports drainage from site at home. Pt continuing to take prescribed Abx from last visit. Pt reports fever and sweats at home. Pt afebrile in triage.

## 2015-08-22 NOTE — ED Notes (Signed)
Provider in room for procedure.

## 2015-08-22 NOTE — ED Notes (Signed)
[place]verton at bedside. 

## 2015-08-22 NOTE — Progress Notes (Signed)
EDCM spoke to patient at bedside. Patient confirms he does not have a pcp or insurance living in [place].  EDCM provided patient with contact information to [facility], informed patient of services there and walk in times.  EDCM also provided patient with list of pcps who accept self pay patients, list of discount pharmacies and websites needymeds.org and GoodRX.com for medication assistance, phone number to inquire about the orange card, phone number to inquire about Medicaid, phone number to inquire about the Affordable Care Act, financial resources in the community such as local churches, [employer], urban ministries, and dental assistance for uninsured patients.  Patient thankful for resources.  No further EDCM needs at this time. 

## 2015-08-22 NOTE — Discharge Instructions (Signed)
Return to the emergency Department in 24 hours to have your abscess and cellulitis reevaluated for possible need for repeat dose of IV antibiotics. Continue clindamycin at home. Return immediately for any worsening swelling, redness, fever, chills or for any concerns.

## 2015-08-23 NOTE — ED Provider Notes (Signed)
[facility]-EMERGENCY DEPT Provider Note   CSN: [id number] Arrival date & time: 08/22/15  1517  First Provider Contact:  First MD Initiated Contact with Patient 08/22/15 1838        History   Chief Complaint Chief Complaint  Patient presents with  . Cellulitis  . Wound Check    HPI Jon Green is a 50 y.o. male. Patient presents with increasing redness, swelling to the right elbow. Was seen in the emergency department on 8/4 for the same. Started on clindamycin. States the swelling has worsened. Has had subjective fevers and chills. He also has had some purulent drainage from the elbow. States symptoms initially started after he was bitten. Unknown insect bite. Denies any rashes, headache, neck pain, nausea or vomiting. HPI  Past Medical History:  Diagnosis Date  . Glaucoma   . Hypertension     There are no active problems to display for this patient.   Past Surgical History:  Procedure Laterality Date  . Right Foot Surgery         Home Medications    Prior to Admission medications   Medication Sig Start Date End Date Taking? Authorizing Provider  clindamycin (CLEOCIN) 150 MG capsule Take 2 capsules (300 mg total) by mouth 4 (four) times daily. 08/19/15  Yes Jeffrey Hedges, PA-C  hydrochlorothiazide (HYDRODIURIL) 25 MG tablet Take 25 mg by mouth daily.   Yes Historical Provider, MD  lisinopril (PRINIVIL,ZESTRIL) 20 MG tablet Take 20 mg by mouth daily.   Yes Historical Provider, MD  timolol (BETIMOL) 0.5 % ophthalmic solution Place 1 drop into both eyes 2 (two) times daily.   Yes Historical Provider, MD  HYDROcodone-acetaminophen (NORCO/VICODIN) 5-325 MG tablet Take 1 tablet by mouth every 6 (six) hours as needed for severe pain. 08/22/15   Loren Racer, MD  ibuprofen (ADVIL,MOTRIN) 800 MG tablet Take 1 tablet (800 mg total) by mouth every 8 (eight) hours as needed for mild pain. Patient not taking: Reported on 08/19/2015 04/30/15   Layla Maw Ward, DO    Family  History History reviewed. No pertinent family history.  Social History Social History  Substance Use Topics  . Smoking status: Never Smoker  . Smokeless tobacco: Never Used  . Alcohol use No     Allergies   Review of patient's allergies indicates no known allergies.   Review of Systems Review of Systems  Constitutional: Positive for chills and fever.  Eyes: Negative for visual disturbance.  Respiratory: Negative for shortness of breath.   Cardiovascular: Negative for chest pain.  Gastrointestinal: Negative for abdominal pain, nausea and vomiting.  Musculoskeletal: Positive for arthralgias.  Skin: Negative for rash.  Neurological: Negative for dizziness, weakness, light-headedness, numbness and headaches.  All other systems reviewed and are negative.    Physical Exam Updated Vital Signs BP (!) 151/105 (BP Location: Left Arm) Comment: pt states he is due to take his bp meds when he gets home  Pulse (!) 57   Temp 97.7 F (36.5 C) (Oral)   Resp 16   Ht  (1.803 m)   Wt 210 lb (95.3 kg)   SpO2 99%   BMI 29.29 kg/m   Physical Exam  Constitutional: He is oriented to person, place, and time. He appears well-developed and well-nourished.  HENT:  Head: Normocephalic and atraumatic.  Mouth/Throat: Oropharynx is clear and moist.  Eyes: EOM are normal. Pupils are equal, round, and reactive to light.  Neck: Normal range of motion. Neck supple.  Cardiovascular: Normal rate and regular rhythm.  Pulmonary/Chest: Effort normal and breath sounds normal.  Abdominal: Soft. Bowel sounds are normal. There is no tenderness. There is no rebound and no guarding.  Musculoskeletal: He exhibits edema and tenderness.  Patient with right elbow swelling and erythema located centrally around fluctuant mass just superficial to the olecranon process. Erythema is roughly 9 x 6 cm in diameter. There is no tracking. Patient has full range of motion of the elbow without pain. Distal pulses are  intact.  Neurological: He is alert and oriented to person, place, and time.  Bilateral grip strength is normal. Sensation fully intact.  Skin: Skin is warm and dry. No rash noted. There is erythema.  Psychiatric: He has a normal mood and affect. His behavior is normal.  Nursing note and vitals reviewed.    ED Treatments / Results  Labs (all labs ordered are listed, but only abnormal results are displayed) Labs Reviewed  CBC WITH DIFFERENTIAL/PLATELET  BASIC METABOLIC PANEL    EKG  EKG Interpretation None       Radiology No results found.  Procedures .Marland Kitchen.Incision and Drainage Date/Time: 08/22/2015 7:17 PM Performed by: Loren RacerYELVERTON, Rashmi Tallent Authorized by: Ranae PalmsYELVERTON, Jakobe Blau   Consent:    Consent obtained:  Verbal   Consent given by:  Patient   Risks discussed:  Bleeding Location:    Type:  Abscess   Location:  Upper extremity   Upper extremity location:  Elbow   Elbow location:  R elbow Pre-procedure details:    Skin preparation:  Betadine Anesthesia (see MAR for exact dosages):    Anesthesia method:  Local infiltration   Local anesthetic:  Lidocaine 2% WITH epi Procedure type:    Complexity:  Simple Procedure details:    Incision types:  Single straight   Incision depth:  Dermal   Scalpel blade:  11   Wound management:  Probed and deloculated   Drainage:  Purulent   Drainage amount:  Moderate   Wound treatment:  Wound left open   Packing materials:  None Post-procedure details:    Patient tolerance of procedure:  Tolerated well, no immediate complications   (including critical care time)  Medications Ordered in ED Medications  lidocaine-EPINEPHrine (XYLOCAINE W/EPI) 2 %-1:100000 (with pres) injection 20 mL (20 mLs Infiltration Given 08/22/15 1859)  vancomycin (VANCOCIN) IVPB 1000 mg/200 mL premix (0 mg Intravenous Stopped 08/22/15 2109)     Initial Impression / Assessment and Plan / ED Course  I have reviewed the triage vital signs and the nursing  notes.  Pertinent labs & imaging results that were available during my care of the patient were reviewed by me and considered in my medical decision making (see chart for details).  Clinical Course    Abscess was opened in the emergency department. A culture was sent. Given dose of IV vancomycin. Patient with significant improvement in erythema and swelling. Erythema was marked. Advised patient to return in 24 hours to have wound rechecked. Patient may need another dose of IV antibiotics. Patient in agreement with plan. Understands return precautions.  Final Clinical Impressions(s) / ED Diagnoses   Final diagnoses:  Cellulitis and abscess    New Prescriptions Discharge Medication List as of 08/22/2015  8:53 PM       Loren Raceravid Moni Rothrock, MD 08/23/15 2219

## 2016-06-19 ENCOUNTER — Emergency Department (HOSPITAL_COMMUNITY)
Admission: EM | Admit: 2016-06-19 | Discharge: 2016-06-20 | Disposition: A | Discharge status: Home / Self Care | Payer: Self-pay | Attending: Emergency Medicine | Admitting: Emergency Medicine

## 2016-06-19 ENCOUNTER — Encounter (HOSPITAL_COMMUNITY): Payer: Self-pay | Admitting: Emergency Medicine

## 2016-06-19 DIAGNOSIS — Z79899 Other long term (current) drug therapy: Secondary | ICD-10-CM | POA: Insufficient documentation

## 2016-06-19 DIAGNOSIS — I10 Essential (primary) hypertension: Secondary | ICD-10-CM | POA: Insufficient documentation

## 2016-06-19 DIAGNOSIS — R0602 Shortness of breath: Secondary | ICD-10-CM | POA: Insufficient documentation

## 2016-06-19 DIAGNOSIS — R066 Hiccough: Secondary | ICD-10-CM | POA: Insufficient documentation

## 2016-06-19 NOTE — ED Triage Notes (Signed)
Pt states he has had hiccups since the day before yesterday  Pt states this is making him feel short of breath

## 2016-06-20 ENCOUNTER — Emergency Department (HOSPITAL_COMMUNITY): Payer: Self-pay

## 2016-06-20 MED ORDER — ONDANSETRON 8 MG PO TBDP
8.0000 mg | ORAL_TABLET | Freq: Once | ORAL | Status: AC
  Administered 2016-06-20: 8 mg via ORAL
  Filled 2016-06-20: qty 1

## 2016-06-20 MED ORDER — METOCLOPRAMIDE HCL 10 MG PO TABS: 10.0000 mg | ORAL_TABLET | Freq: Three times a day (TID) | ORAL | 0 refills | Status: DC | PRN

## 2016-06-20 MED ORDER — METOCLOPRAMIDE HCL 10 MG PO TABS
10.0000 mg | ORAL_TABLET | Freq: Once | ORAL | Status: AC
  Administered 2016-06-20: 10 mg via ORAL
  Filled 2016-06-20: qty 1

## 2016-06-20 NOTE — ED Provider Notes (Signed)
[facility]-EMERGENCY DEPT Provider Note   CSN: [id number] Arrival date & time: 06/19/16  1939  By signing my name below, I, Ny'Kea Lewis, attest that this documentation has been prepared under the direction and in the presence of Zadie RhineWickline, Siria Calandro, MD. Electronically Signed: Karren CobbleNy'Kea Lewis, ED Scribe. 06/20/16. 12:34 AM.  History   Chief Complaint Chief Complaint  Patient presents with  . Hiccups   HPI HPI Comments: Jon Green is a 51 y.o. male with a PMHx of HTN, who presents to the Emergency Department complaining of sudden onset, gradually worsening hiccups that began four days ago. Pt notes associated shortness of breath and emesis when belching. Pt reports four days ago after eating a tuna sandwich from [employer] he began to belch which later turned into hiccups that he has been unable to get rid of. He suspected it was GERD and tried Tums and Rolaids with no relief. He has be unable to take full deep breaths. Never experienced hiccups like this before. Denies fever, chest pain, abdominal pain, or back pain.   The history is provided by the patient. No language interpreter was used.   Past Medical History:  Diagnosis Date  . Glaucoma   . Hypertension    There are no active problems to display for this patient.  Past Surgical History:  Procedure Laterality Date  . Right Foot Surgery      Home Medications    Prior to Admission medications   Medication Sig Start Date End Date Taking? Authorizing Provider  clindamycin (CLEOCIN) 150 MG capsule Take 2 capsules (300 mg total) by mouth 4 (four) times daily. 08/19/15   Hedges, Tinnie GensJeffrey, PA-C  hydrochlorothiazide (HYDRODIURIL) 25 MG tablet Take 25 mg by mouth daily.    [provider]  HYDROcodone-acetaminophen (NORCO/VICODIN) 5-325 MG tablet Take 1 tablet by mouth every 6 (six) hours as needed for severe pain. 08/22/15   Loren RacerYelverton, David, MD  ibuprofen (ADVIL,MOTRIN) 800 MG tablet Take 1 tablet (800 mg total) by mouth every 8 (eight)  hours as needed for mild pain. Patient not taking: Reported on 08/19/2015 04/30/15   Ward, Layla MawKristen N, DO  lisinopril (PRINIVIL,ZESTRIL) 20 MG tablet Take 20 mg by mouth daily.    [provider]  timolol (BETIMOL) 0.5 % ophthalmic solution Place 1 drop into both eyes 2 (two) times daily.    [provider]   Family History Family History  Problem Relation Age of Onset  . Hypertension Other    Social History Social History  Substance Use Topics  . Smoking status: Never Smoker  . Smokeless tobacco: Never Used  . Alcohol use No   Allergies   Patient has no known allergies.  Review of Systems Review of Systems  Constitutional: Negative for fever.  Respiratory: Positive for shortness of breath.   Cardiovascular: Negative for chest pain.  Gastrointestinal: Positive for vomiting. Negative for abdominal pain.  Musculoskeletal: Negative for back pain.  All other systems reviewed and are negative.   Physical Exam Updated Vital Signs BP (!) 145/112 (BP Location: Left Arm)   Pulse (!) 59   Temp 98.2 F (36.8 C) (Oral)   Resp 20   SpO2 99%   Physical Exam CONSTITUTIONAL: Well developed/well nourished HEAD: Normocephalic/atraumatic EYES: EOMI/PERRL ENMT: Mucous membranes moist, uvula midline , no erythema or exudate. No stridor, no drooling NECK: supple no meningeal signs SPINE/BACK:entire spine nontender CV: S1/S2 noted, no murmurs/rubs/gallops noted LUNGS: Lungs are clear to auscultation bilaterally, no apparent distress. Pt with frequent hiccups during exam.  ABDOMEN: soft, nontender NEURO: Pt is awake/alert/appropriate, moves all extremitiesx4.  No facial droop.   EXTREMITIES: pulses normal/equal, full ROM SKIN: warm, color normal PSYCH: no abnormalities of mood noted, alert and oriented to situation   ED Treatments / Results  DIAGNOSTIC STUDIES: Oxygen Saturation is 99% on RA, normal by my interpretation.   COORDINATION OF CARE: 12:16 AM-Discussed  next steps with pt. Pt verbalized understanding and is agreeable with the plan.   Labs (all labs ordered are listed, but only abnormal results are displayed) Labs Reviewed - No data to display  EKG  EKG Interpretation None       Radiology Dg Chest 2 View  Result Date: 06/20/2016 CLINICAL DATA:  Shortness of breath for 4 days. EXAM: CHEST  2 VIEW COMPARISON:  None. FINDINGS: The heart is normal in size, mild tortuosity of the thoracic aorta. Pulmonary vasculature is normal. No consolidation, pleural effusion, or pneumothorax. No acute osseous abnormalities are seen. IMPRESSION: Clear lungs. Electronically Signed   By: Rubye Oaks M.D.   On: 06/20/2016 00:42    Procedures Procedures    Medications Ordered in ED Medications  ondansetron (ZOFRAN-ODT) disintegrating tablet 8 mg (8 mg Oral Given 06/20/16 0055)  metoCLOPramide (REGLAN) tablet 10 mg (10 mg Oral Given 06/20/16 0055)     Initial Impression / Assessment and Plan / ED Course  I have reviewed the triage vital signs and the nursing notes.  Pertinent  imaging results that were available during my care of the patient were reviewed by me and considered in my medical decision making (see chart for details).     Pt improved CXR negative Improved with meds No hypoxia He is well appearing Will d/c home Refer to GI Will give Rx for reglan   Final Clinical Impressions(s) / ED Diagnoses   Final diagnoses:  Singultus    New Prescriptions Discharge Medication List as of 06/20/2016  1:26 AM    START taking these medications   Details  metoCLOPramide (REGLAN) 10 MG tablet Take 1 tablet (10 mg total) by mouth every 8 (eight) hours as needed for nausea., Starting Wed 06/20/2016, Print      I personally performed the services described in this documentation, which was scribed in my presence. The recorded information has been reviewed and is accurate.        Zadie Rhine, MD 06/20/16 Earle Gell

## 2016-09-17 ENCOUNTER — Encounter (HOSPITAL_BASED_OUTPATIENT_CLINIC_OR_DEPARTMENT_OTHER): Payer: Self-pay

## 2016-09-17 ENCOUNTER — Emergency Department (HOSPITAL_BASED_OUTPATIENT_CLINIC_OR_DEPARTMENT_OTHER)
Admission: EM | Admit: 2016-09-17 | Discharge: 2016-09-17 | Disposition: A | Discharge status: Home / Self Care | Payer: Self-pay | Attending: Emergency Medicine | Admitting: Emergency Medicine

## 2016-09-17 DIAGNOSIS — Z79899 Other long term (current) drug therapy: Secondary | ICD-10-CM | POA: Insufficient documentation

## 2016-09-17 DIAGNOSIS — K644 Residual hemorrhoidal skin tags: Secondary | ICD-10-CM | POA: Insufficient documentation

## 2016-09-17 DIAGNOSIS — I10 Essential (primary) hypertension: Secondary | ICD-10-CM | POA: Insufficient documentation

## 2016-09-17 MED ORDER — POLYETHYLENE GLYCOL 3350 17 G PO PACK: 17.0000 g | PACK | Freq: Every day | ORAL | 0 refills | Status: AC

## 2016-09-17 MED ORDER — DOCUSATE SODIUM 100 MG PO CAPS: 100.0000 mg | ORAL_CAPSULE | Freq: Two times a day (BID) | ORAL | 0 refills | Status: AC

## 2016-09-17 MED ORDER — HYDROCORTISONE ACETATE 25 MG RE SUPP: 25.0000 mg | Freq: Every day | RECTAL | 0 refills | Status: AC

## 2016-09-17 MED ORDER — PRAMOXINE HCL 1 % RE FOAM
1.0000 | Freq: Three times a day (TID) | RECTAL | 0 refills | Status: AC | PRN
Start: 2016-09-17 — End: ?

## 2016-09-17 NOTE — ED Provider Notes (Signed)
[facility] Note   CSN: [id number] Arrival date & time: 09/17/16  1540     History   Chief Complaint Chief Complaint  Patient presents with  . Hemorrhoids    HPI Jon Green is a 51 y.o. male.  HPI 51 year old male presents with concern for a hemorrhoid. He states he's felt a bulge at his anus for the last 4-5 days. He states that it seems to be worsening. He has tried warm sitz baths, Preparation H without relief. He has about 2 bowel movements a day but they are somewhat firm. No abdominal pain or vomiting. No drainage from the swelling. No rectal bleeding. No prior history of hemorrhoids. No alcohol use.  Past Medical History:  Diagnosis Date  . Glaucoma   . Hypertension     There are no active problems to display for this patient.   Past Surgical History:  Procedure Laterality Date  . Right Foot Surgery         Home Medications    Prior to Admission medications   Medication Sig Start Date End Date Taking? Authorizing Provider  docusate sodium (COLACE) 100 MG capsule Take 1 capsule (100 mg total) by mouth every 12 (twelve) hours. 09/17/16   Pricilla Loveless, MD  hydrochlorothiazide (HYDRODIURIL) 25 MG tablet Take 25 mg by mouth daily.    [provider]  hydrocortisone (ANUSOL-HC) 25 MG suppository Place 1 suppository (25 mg total) rectally daily. For 7 days 09/17/16   Pricilla Loveless, MD  lisinopril (PRINIVIL,ZESTRIL) 20 MG tablet Take 20 mg by mouth daily.    [provider]  polyethylene glycol (MIRALAX / GLYCOLAX) packet Take 17 g by mouth daily. 09/17/16   Pricilla Loveless, MD  pramoxine (PROCTOFOAM) 1 % foam Place 1 application rectally 3 (three) times daily as needed for anal itching. 09/17/16   Pricilla Loveless, MD    Family History Family History  Problem Relation Age of Onset  . Hypertension Other     Social History Social History  Substance Use Topics  . Smoking status: Never Smoker  . Smokeless tobacco: Never  Used  . Alcohol use No     Allergies   Patient has no known allergies.   Review of Systems Review of Systems  Constitutional: Negative for fever.  Gastrointestinal: Positive for rectal pain. Negative for abdominal pain, blood in stool and vomiting.  All other systems reviewed and are negative.    Physical Exam Updated Vital Signs BP 111/74 (BP Location: Right Arm)   Pulse 60   Temp 97.7 F (36.5 C) (Oral)   Resp 18   Ht 5\' 11"  (1.803 m)   Wt 96 kg (211 lb 10.3 oz)   SpO2 99%   BMI 29.52 kg/m   Physical Exam  Constitutional: He is oriented to person, place, and time. He appears well-developed and well-nourished.  HENT:  Head: Normocephalic and atraumatic.  Right Ear: External ear normal.  Left Ear: External ear normal.  Nose: Nose normal.  Eyes: Right eye exhibits no discharge. Left eye exhibits no discharge.  Neck: Neck supple.  Pulmonary/Chest: Effort normal.  Abdominal: Soft. He exhibits no distension. There is no tenderness.  Genitourinary: Rectal exam shows external hemorrhoid. Rectal exam shows no fissure.  Genitourinary Comments: Nonthrombosed, soft but tender external hemorrhoid (about 2 cm in diameter)  Musculoskeletal: He exhibits no edema.  Neurological: He is alert and oriented to person, place, and time.  Skin: Skin is warm and dry. He is not diaphoretic.  Nursing note  and vitals reviewed.    ED Treatments / Results  Labs (all labs ordered are listed, but only abnormal results are displayed) Labs Reviewed - No data to display  EKG  EKG Interpretation None       Radiology No results found.  Procedures Procedures (including critical care time)  Medications Ordered in ED Medications - No data to display   Initial Impression / Assessment and Plan / ED Course  I have reviewed the triage vital signs and the nursing notes.  Pertinent labs & imaging results that were available during my care of the patient were reviewed by me and  considered in my medical decision making (see chart for details).     Patient has a nonthrombosed external hemorrhoid. It is tender but it is still soft and with no skin color changes. Thus, will try other topical medicines and encourage increased fiber, stool softener and Miralax. He was encouraged to follow-up with general surgery closely as well for possible excision. No indication for emergent hemorrhoidectomy. Discussed return precautions. Encouraged to stop other meds besides ibuprofen/tylenol for the treatment of hemorrhoid so as not to double up on meds.  Final Clinical Impressions(s) / ED Diagnoses   Final diagnoses:  External hemorrhoid    New Prescriptions Discharge Medication List as of 09/17/2016  5:18 PM    START taking these medications   Details  docusate sodium (COLACE) 100 MG capsule Take 1 capsule (100 mg total) by mouth every 12 (twelve) hours., Starting Mon 09/17/2016, Print    hydrocortisone (ANUSOL-HC) 25 MG suppository Place 1 suppository (25 mg total) rectally daily. For 7 days, Starting Mon 09/17/2016, Print    polyethylene glycol (MIRALAX / GLYCOLAX) packet Take 17 g by mouth daily., Starting Mon 09/17/2016, Print    pramoxine (PROCTOFOAM) 1 % foam Place 1 application rectally 3 (three) times daily as needed for anal itching., Starting Mon 09/17/2016, Print         Pricilla LovelessGoldston, Wilbern Pennypacker, MD 09/17/16 [phone]

## 2016-09-17 NOTE — ED Triage Notes (Signed)
C/o hemorrhoid x 4-5 days-grimacing-slow gait

## 2016-09-17 NOTE — ED Notes (Signed)
Pt stated that it hurts when he sits, walks and stand.  The hemorrhoids came out in the past 5 days and he had never experienced like this before.

## 2020-11-29 ENCOUNTER — Ambulatory Visit: Admit: 2020-11-29 | Discharge: 2020-11-29 | Attending: Foot Surgery

## 2020-11-29 ENCOUNTER — Ambulatory Visit: Admit: 2020-11-29 | Discharge: 2020-11-29

## 2020-11-29 DIAGNOSIS — M79671 Pain in right foot: Secondary | ICD-10-CM
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
